# Patient Record
Sex: Male | Born: 1965 | Marital: Married | Smoking: Never smoker
Health system: Southern US, Community
[De-identification: ages and names within clinical notes are randomized; demographics above are authoritative.]

## PROBLEM LIST (undated history)

## (undated) DIAGNOSIS — K219 Gastro-esophageal reflux disease without esophagitis: Secondary | ICD-10-CM

## (undated) DIAGNOSIS — Z201 Contact with and (suspected) exposure to tuberculosis: Secondary | ICD-10-CM

## (undated) DIAGNOSIS — IMO0001 Reserved for inherently not codable concepts without codable children: Secondary | ICD-10-CM

## (undated) DIAGNOSIS — C3490 Malignant neoplasm of unspecified part of unspecified bronchus or lung: Secondary | ICD-10-CM

---

## 2012-07-15 ENCOUNTER — Emergency Department (HOSPITAL_COMMUNITY)
Admission: EM | Admit: 2012-07-15 | Discharge: 2012-07-15 | Disposition: A | Discharge status: Home / Self Care | Payer: No Typology Code available for payment source | Attending: Emergency Medicine | Admitting: Emergency Medicine

## 2012-07-15 ENCOUNTER — Emergency Department (HOSPITAL_COMMUNITY): Payer: No Typology Code available for payment source

## 2012-07-15 DIAGNOSIS — T07XXXA Unspecified multiple injuries, initial encounter: Secondary | ICD-10-CM

## 2012-07-15 DIAGNOSIS — Y9389 Activity, other specified: Secondary | ICD-10-CM | POA: Insufficient documentation

## 2012-07-15 DIAGNOSIS — S6990XA Unspecified injury of unspecified wrist, hand and finger(s), initial encounter: Secondary | ICD-10-CM | POA: Insufficient documentation

## 2012-07-15 DIAGNOSIS — Y92410 Unspecified street and highway as the place of occurrence of the external cause: Secondary | ICD-10-CM | POA: Insufficient documentation

## 2012-07-15 DIAGNOSIS — S3981XA Other specified injuries of abdomen, initial encounter: Secondary | ICD-10-CM | POA: Insufficient documentation

## 2012-07-15 LAB — CBC WITH DIFFERENTIAL/PLATELET
Eosinophils Absolute: 0.2 10*3/uL (ref 0.0–0.7)
Eosinophils Relative: 3 % (ref 0–5)
HCT: 43.4 % (ref 39.0–52.0)
Hemoglobin: 14.4 g/dL (ref 13.0–17.0)
Lymphs Abs: 1.8 10*3/uL (ref 0.7–4.0)
MCH: 29 pg (ref 26.0–34.0)
MCV: 87.5 fL (ref 78.0–100.0)
Monocytes Relative: 7 % (ref 3–12)
RBC: 4.96 MIL/uL (ref 4.22–5.81)

## 2012-07-15 LAB — BASIC METABOLIC PANEL
BUN: 12 mg/dL (ref 6–23)
Calcium: 9.2 mg/dL (ref 8.4–10.5)
GFR calc non Af Amer: >90 mL/min (ref >90)
Glucose, Bld: 85 mg/dL (ref 70–99)

## 2012-07-15 LAB — URINALYSIS, ROUTINE W REFLEX MICROSCOPIC
Glucose, UA: NEGATIVE mg/dL
Hgb urine dipstick: NEGATIVE
Ketones, ur: NEGATIVE mg/dL
Protein, ur: NEGATIVE mg/dL
pH: 5.5 (ref 5.0–8.0)

## 2012-07-15 LAB — URINE MICROSCOPIC-ADD ON

## 2012-07-15 NOTE — ED Notes (Addendum)
Pt c/o right hand pain; pt c/o abd pain into back and neck pain; pt sts pain generalized and denies N/V and started yesterday
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
## 2012-07-15 NOTE — ED Notes (Signed)
Pt transported to xray 

## 2014-05-05 ENCOUNTER — Emergency Department (HOSPITAL_COMMUNITY): Payer: BC Managed Care – PPO

## 2014-05-05 ENCOUNTER — Inpatient Hospital Stay (HOSPITAL_COMMUNITY): Payer: BC Managed Care – PPO

## 2014-05-05 ENCOUNTER — Encounter (HOSPITAL_COMMUNITY): Payer: Self-pay | Admitting: Emergency Medicine

## 2014-05-05 ENCOUNTER — Inpatient Hospital Stay (HOSPITAL_COMMUNITY)
Admission: EM | Admit: 2014-05-05 | Discharge: 2014-05-12 | DRG: 823 | Disposition: A | Discharge status: Home Health | Payer: BC Managed Care – PPO | Attending: Internal Medicine | Admitting: Internal Medicine

## 2014-05-05 DIAGNOSIS — C7931 Secondary malignant neoplasm of brain: Secondary | ICD-10-CM | POA: Diagnosis present

## 2014-05-05 DIAGNOSIS — R519 Headache, unspecified: Secondary | ICD-10-CM

## 2014-05-05 DIAGNOSIS — C3412 Malignant neoplasm of upper lobe, left bronchus or lung: Secondary | ICD-10-CM | POA: Diagnosis present

## 2014-05-05 DIAGNOSIS — C7951 Secondary malignant neoplasm of bone: Secondary | ICD-10-CM | POA: Diagnosis present

## 2014-05-05 DIAGNOSIS — R591 Generalized enlarged lymph nodes: Secondary | ICD-10-CM | POA: Diagnosis present

## 2014-05-05 DIAGNOSIS — R51 Headache: Secondary | ICD-10-CM

## 2014-05-05 DIAGNOSIS — G4452 New daily persistent headache (NDPH): Secondary | ICD-10-CM | POA: Diagnosis present

## 2014-05-05 DIAGNOSIS — M8458XA Pathological fracture in neoplastic disease, other specified site, initial encounter for fracture: Secondary | ICD-10-CM | POA: Diagnosis present

## 2014-05-05 DIAGNOSIS — C7801 Secondary malignant neoplasm of right lung: Secondary | ICD-10-CM | POA: Diagnosis present

## 2014-05-05 DIAGNOSIS — C7802 Secondary malignant neoplasm of left lung: Secondary | ICD-10-CM | POA: Diagnosis present

## 2014-05-05 DIAGNOSIS — F17220 Nicotine dependence, chewing tobacco, uncomplicated: Secondary | ICD-10-CM | POA: Diagnosis present

## 2014-05-05 DIAGNOSIS — G936 Cerebral edema: Secondary | ICD-10-CM | POA: Diagnosis present

## 2014-05-05 DIAGNOSIS — R222 Localized swelling, mass and lump, trunk: Secondary | ICD-10-CM | POA: Diagnosis present

## 2014-05-05 DIAGNOSIS — M545 Low back pain: Secondary | ICD-10-CM | POA: Diagnosis present

## 2014-05-05 DIAGNOSIS — C799 Secondary malignant neoplasm of unspecified site: Secondary | ICD-10-CM

## 2014-05-05 DIAGNOSIS — R9389 Abnormal findings on diagnostic imaging of other specified body structures: Secondary | ICD-10-CM

## 2014-05-05 DIAGNOSIS — R938 Abnormal findings on diagnostic imaging of other specified body structures: Secondary | ICD-10-CM

## 2014-05-05 DIAGNOSIS — R7611 Nonspecific reaction to tuberculin skin test without active tuberculosis: Secondary | ICD-10-CM | POA: Diagnosis present

## 2014-05-05 DIAGNOSIS — Z23 Encounter for immunization: Secondary | ICD-10-CM | POA: Diagnosis not present

## 2014-05-05 DIAGNOSIS — J029 Acute pharyngitis, unspecified: Secondary | ICD-10-CM | POA: Diagnosis present

## 2014-05-05 DIAGNOSIS — C770 Secondary and unspecified malignant neoplasm of lymph nodes of head, face and neck: Secondary | ICD-10-CM | POA: Diagnosis present

## 2014-05-05 DIAGNOSIS — Z201 Contact with and (suspected) exposure to tuberculosis: Secondary | ICD-10-CM | POA: Diagnosis present

## 2014-05-05 DIAGNOSIS — C3490 Malignant neoplasm of unspecified part of unspecified bronchus or lung: Secondary | ICD-10-CM | POA: Diagnosis present

## 2014-05-05 DIAGNOSIS — R221 Localized swelling, mass and lump, neck: Secondary | ICD-10-CM

## 2014-05-05 HISTORY — DX: Malignant neoplasm of unspecified part of unspecified bronchus or lung: C34.90

## 2014-05-05 HISTORY — DX: Contact with and (suspected) exposure to tuberculosis: Z20.1

## 2014-05-05 LAB — CBC WITH DIFFERENTIAL/PLATELET
BASOS ABS: 0 10*3/uL (ref 0.0–0.1)
BASOS PCT: 1 % (ref 0–1)
EOS ABS: 0.2 10*3/uL (ref 0.0–0.7)
EOS PCT: 4 % (ref 0–5)
HCT: 41.4 % (ref 39.0–52.0)
HEMOGLOBIN: 14.5 g/dL (ref 13.0–17.0)
Lymphocytes Relative: 22 % (ref 12–46)
Lymphs Abs: 1.4 10*3/uL (ref 0.7–4.0)
MCH: 29.2 pg (ref 26.0–34.0)
MCHC: 35 g/dL (ref 30.0–36.0)
MCV: 83.5 fL (ref 78.0–100.0)
MONO ABS: 0.5 10*3/uL (ref 0.1–1.0)
MONOS PCT: 8 % (ref 3–12)
NEUTROS ABS: 4.2 10*3/uL (ref 1.7–7.7)
Neutrophils Relative %: 67 % (ref 43–77)
Platelets: 198 10*3/uL (ref 150–400)
RBC: 4.96 MIL/uL (ref 4.22–5.81)
RDW: 12.2 % (ref 11.5–15.5)
WBC: 6.2 10*3/uL (ref 4.0–10.5)

## 2014-05-05 LAB — BASIC METABOLIC PANEL
ANION GAP: 11 (ref 5–15)
BUN: 15 mg/dL (ref 6–23)
CALCIUM: 9.3 mg/dL (ref 8.4–10.5)
CHLORIDE: 103 meq/L (ref 96–112)
CO2: 24 mEq/L (ref 19–32)
CREATININE: 1.29 mg/dL (ref 0.50–1.35)
GFR, EST AFRICAN AMERICAN: 73 mL/min — AB (ref >90)
GFR, EST NON AFRICAN AMERICAN: 63 mL/min — AB (ref >90)
Glucose, Bld: 108 mg/dL — ABNORMAL HIGH (ref 70–99)
Potassium: 4 mEq/L (ref 3.7–5.3)
Sodium: 138 mEq/L (ref 137–147)

## 2014-05-05 LAB — RAPID STREP SCREEN (MED CTR MEBANE ONLY): Streptococcus, Group A Screen (Direct): NEGATIVE

## 2014-05-05 MED ORDER — ONDANSETRON HCL 4 MG/2ML IJ SOLN: 4.0000 mg | Freq: Four times a day (QID) | INTRAMUSCULAR | Status: DC | PRN

## 2014-05-05 MED ORDER — ONDANSETRON HCL 4 MG/2ML IJ SOLN
4.0000 mg | Freq: Once | INTRAMUSCULAR | Status: AC
  Administered 2014-05-05: 4 mg via INTRAVENOUS
  Filled 2014-05-05: qty 2

## 2014-05-05 MED ORDER — ONDANSETRON HCL 4 MG PO TABS: 4.0000 mg | ORAL_TABLET | Freq: Four times a day (QID) | ORAL | Status: DC | PRN

## 2014-05-05 MED ORDER — IOHEXOL 300 MG/ML  SOLN
75.0000 mL | Freq: Once | INTRAMUSCULAR | Status: AC | PRN
  Administered 2014-05-05: 75 mL via INTRAVENOUS

## 2014-05-05 MED ORDER — ONDANSETRON 4 MG PO TBDP: 4.0000 mg | ORAL_TABLET | Freq: Once | ORAL | Status: DC

## 2014-05-05 MED ORDER — SODIUM CHLORIDE 0.9 % IV BOLUS (SEPSIS)
1000.0000 mL | Freq: Once | INTRAVENOUS | Status: AC
  Administered 2014-05-05: 1000 mL via INTRAVENOUS

## 2014-05-05 MED ORDER — DOCUSATE SODIUM 100 MG PO CAPS
100.0000 mg | ORAL_CAPSULE | Freq: Two times a day (BID) | ORAL | Status: DC
  Administered 2014-05-06 – 2014-05-12 (×12): 100 mg via ORAL
  Filled 2014-05-05 (×14): qty 1

## 2014-05-05 MED ORDER — HYDROCODONE-ACETAMINOPHEN 5-325 MG PO TABS: 1.0000 | ORAL_TABLET | Freq: Once | ORAL | Status: DC

## 2014-05-05 MED ORDER — MORPHINE SULFATE 4 MG/ML IJ SOLN
4.0000 mg | Freq: Once | INTRAMUSCULAR | Status: AC
  Administered 2014-05-05: 4 mg via INTRAVENOUS
  Filled 2014-05-05: qty 1

## 2014-05-05 MED ORDER — ACETAMINOPHEN 650 MG RE SUPP: 650.0000 mg | Freq: Four times a day (QID) | RECTAL | Status: DC | PRN

## 2014-05-05 MED ORDER — ACETAMINOPHEN 325 MG PO TABS: 650.0000 mg | ORAL_TABLET | Freq: Four times a day (QID) | ORAL | Status: DC | PRN

## 2014-05-05 MED ORDER — HYDROCODONE-ACETAMINOPHEN 5-325 MG PO TABS
1.0000 | ORAL_TABLET | ORAL | Status: DC | PRN
  Administered 2014-05-07 – 2014-05-12 (×7): 2 via ORAL
  Filled 2014-05-05 (×7): qty 2

## 2014-05-05 MED ORDER — MORPHINE SULFATE 2 MG/ML IJ SOLN
2.0000 mg | INTRAMUSCULAR | Status: DC | PRN
  Administered 2014-05-08 – 2014-05-11 (×2): 2 mg via INTRAVENOUS
  Filled 2014-05-05 (×2): qty 1

## 2014-05-05 NOTE — ED Provider Notes (Signed)
CSN: [id number]     Arrival date & time 05/05/14  1452 History  This chart was scribed for non-physician practitioner, Margarita Mail, PA-C,working with Richarda Blade, MD, by Marlowe Kays, ED Scribe. This patient was seen in room TR10C/TR10C and the patient's care was started at 4:40 PM  Chief Complaint  Patient presents with  . Sore Throat   Patient is a 48 y.o. male presenting with pharyngitis. The history is provided by the patient. A language interpreter was used.  Sore Throat  Sore Throat Associated symptoms include chills and a sore throat. Pertinent negatives include no diaphoresis, fever, nausea or vomiting.   HPI Comments:  Russell Stanley is a 48 y.o. male who presents to the Emergency Department complaining of sore throat for one month. Pt reports that there is an associated generalized body aches and a painful nodule on the left side of his neck. He reports the pain is a 2-3/10. He has taken medication for pain but cannot recall the name. He denies appetite change, weight loss, otalgia, dental pain, difficulty swallowing, fever, night sweats, nausea or vomiting.He has a slight cough but denies hemoptysis. Pt does not have a PCP. He is not a smoker but reports chewing tobacco.  History reviewed. No pertinent past medical history. History reviewed. No pertinent past surgical history. History reviewed. No pertinent family history. History  Substance Use Topics  . Smoking status: Never Smoker   . Smokeless tobacco: Current User    Types: Chew  . Alcohol Use: No    Review of Systems  Constitutional: Positive for chills. Negative for fever, diaphoresis and appetite change.  HENT: Positive for sore throat. Negative for dental problem, ear pain and trouble swallowing.   Gastrointestinal: Negative for nausea and vomiting.  Hematological: Positive for adenopathy.  All other systems reviewed and are negative.   Allergies  Review of patient's allergies indicates no known  allergies.  Home Medications   Prior to Admission medications   Not on File   Triage Vitals: BP 115/77  Pulse 86  Temp(Src) 97.6 F (36.4 C) (Oral)  Resp 12  SpO2 97% Physical Exam  Nursing note and vitals reviewed. Constitutional: He is oriented to person, place, and time. He appears well-developed and well-nourished.  HENT:  Head: Normocephalic and atraumatic.  Poor dentition. Multiple dental carries.  Eyes: EOM are normal.  Neck: Normal range of motion.  Cardiovascular: Normal rate, regular rhythm and normal heart sounds.  Exam reveals no gallop and no friction rub.   No murmur heard. Pulmonary/Chest: Effort normal and breath sounds normal. No respiratory distress. He has no wheezes. He has no rales.  Musculoskeletal: Normal range of motion.  Lymphadenopathy:    He has cervical adenopathy.  Larger than 2 cm swollen lymphadenopathy in posterior cervical chain of left side only.  Neurological: He is alert and oriented to person, place, and time.  Skin: Skin is warm and dry.  Psychiatric: He has a normal mood and affect. His behavior is normal.    ED Course  Procedures (including critical care time) DIAGNOSTIC STUDIES: Oxygen Saturation is 97% on RA, normal by my interpretation.   COORDINATION OF CARE: 4:53 PM- Will speak with Dr. Eulis Foster about appropriate course of treatment. Pt verbalizes understanding and agrees to plan.  Medications  acetaminophen (TYLENOL) tablet 650 mg (not administered)    Or  acetaminophen (TYLENOL) suppository 650 mg (not administered)  HYDROcodone-acetaminophen (NORCO/VICODIN) 5-325 MG per tablet 1-2 tablet (not administered)  docusate sodium (COLACE) capsule 100 mg (  100 mg Oral Not Given 05/06/14 0000)  ondansetron (ZOFRAN) tablet 4 mg (not administered)    Or  ondansetron (ZOFRAN) injection 4 mg (not administered)  morphine 2 MG/ML injection 2 mg (not administered)  0.9 %  sodium chloride infusion (not administered)  sodium chloride  HYPERTONIC 3 % nebulizer solution 5 mL (not administered)  sodium chloride 0.9 % bolus 1,000 mL (0 mLs Intravenous Stopped 05/05/14 2020)  iohexol (OMNIPAQUE) 300 MG/ML solution 75 mL (75 mLs Intravenous Contrast Given 05/05/14 1835)  morphine 4 MG/ML injection 4 mg (4 mg Intravenous Given 05/05/14 2206)  ondansetron (ZOFRAN) injection 4 mg (4 mg Intravenous Given 05/05/14 2203)    Labs Review Labs Reviewed  BASIC METABOLIC PANEL - Abnormal; Notable for the following:    Glucose, Bld 108 (*)    GFR calc non Af Amer 63 (*)    GFR calc Af Amer 73 (*)    All other components within normal limits  COMPREHENSIVE METABOLIC PANEL - Abnormal; Notable for the following:    Sodium 136 (*)    Glucose, Bld 176 (*)    AST 45 (*)    Alkaline Phosphatase 407 (*)    All other components within normal limits  LACTATE DEHYDROGENASE - Abnormal; Notable for the following:    LDH 271 (*)    All other components within normal limits  RAPID STREP SCREEN  CULTURE, GROUP A STREP  AFB CULTURE WITH SMEAR  CULTURE, EXPECTORATED SPUTUM-ASSESSMENT  AFB CULTURE WITH SMEAR  BODY FLUID CULTURE  FUNGUS CULTURE W SMEAR  CBC WITH DIFFERENTIAL  MAGNESIUM  PHOSPHORUS  TSH  CBC  PROTIME-INR  APTT  QUANTIFERON TB GOLD ASSAY  HIV ANTIBODY (ROUTINE TESTING)  HEPATITIS PANEL, ACUTE  SURGICAL PATHOLOGY    Imaging Review Ct Head Wo Contrast  05/06/2014   CLINICAL DATA:  48 year old male with headache.  Initial encounter.  EXAM: CT HEAD WITHOUT CONTRAST  TECHNIQUE: Contiguous axial images were obtained from the base of the skull through the vertex without intravenous contrast.  COMPARISON:  Neck CT same date.  FINDINGS: There are at least 3 hyperdense lesions within the brain located within the medial aspect of the posterior frontal lobes and right parietal lobe. Left posterior medial frontal lobe lesion with minimal surrounding vasogenic edema. No mass effect. Given the CT findings, intracranial findings may  represent intracranial metastatic disease. Spread of infection not entirely excluded given the miliary appearance of the visualized lungs. Follow-up brain MR with contrast would be helpful for further delineation.  No intracranial hemorrhage separate from the above described findings.  No hydrocephalus.  No CT evidence of large acute infarct.  Remote medial wall fracture right orbit.  IMPRESSION: At least 3 intracranial lesions possibly representing metastatic disease with infection not entirely excluded given the miliary appearance of the visualized upper lung. MR with contrast may be considered for further delineation.   Electronically Signed   By: Chauncey Cruel M.D.   On: 05/06/2014 00:06   Ct Soft Tissue Neck W Contrast  05/05/2014   ADDENDUM REPORT: 05/05/2014 20:33  ADDENDUM: Results were discussed with Dr. Roel Cluck, the possibility of infection such as miliary TB with superimposed active TB is a consideration. Aspiration of the necrotic left level 5 lymph node with culture and cytology may prove helpful for further delineation. Additionally, formal chest CT may be considered.   Electronically Signed   By: Chauncey Cruel M.D.   On: 05/05/2014 20:33   05/05/2014   CLINICAL DATA:  48 year old male presenting  with left-sided neck pain for the past month getting worse. Initial encounter.  EXAM: CT NECK WITH CONTRAST  TECHNIQUE: Multidetector CT imaging of the neck was performed using the standard protocol following the bolus administration of intravenous contrast.  CONTRAST:  26mL OMNIPAQUE IOHEXOL 300 MG/ML  SOLN  COMPARISON:  None.  FINDINGS: Left upper lobe 0 4.8 x 4.2 x 3.2 cm mass highly concerning for primary malignancy with diffuse miliary pulmonary metastatic lesions bilaterally. Additionally, neck adenopathy greater on the left and most notable in the left supraclavicular region and left level 5 region where necrotic adenopathy with extracapsular spread is noted.  Small pericardial effusion. Left hilar  and subcarinal/pretracheal/AP window adenopathy.  Osseous metastatic disease to the T5 vertebral body suspected.  No primary neck mass is noted. Direct visualization would be necessary to exclude subtle mucosal abnormality.  Limited imaging of intracranial structures without evidence of metastatic disease.  IMPRESSION: Left upper lobe 0 4.8 x 4.2 x 3.2 cm mass highly concerning for primary malignancy with diffuse miliary pulmonary metastatic lesions bilaterally.  Neck adenopathy greater on the left and most notable in the left supraclavicular region and left level 5 region where necrotic adenopathy with extracapsular spread is noted.  Small pericardial effusion. Left hilar and subcarinal/pretracheal/AP window adenopathy.  Osseous metastatic disease to the T5 vertebral body suspected.  No primary neck mass is noted. Direct visualization would be necessary to exclude subtle mucosal abnormality.  These results were called by telephone at the time of interpretation on 05/05/2014 at 7:04pm to [facility] PA, who verbally acknowledged these results.  Electronically Signed: By: Chauncey Cruel M.D. On: 05/05/2014 19:10     EKG Interpretation None      MDM   Final diagnoses:  Headache  New daily persistent headache  Abnormal CT scan, chest  Chest mass  Neck mass  Brain metastases    Discussed case with Dr Eulis Foster. Concern for infectious vs neoplastic process. No outside f/u. Will obtain a ct soft tissue and basic labs.  Patient CT concerning for Miliary metastatic disease vs. TB. Patient placed on aerosol precautions. He denies any known exposure to TB.   Patient labs are unremarkable.Pateitn admitted by Dr. Roel Cluck.   I personally performed the services described in this documentation, which was scribed in my presence. The recorded information has been reviewed and is accurate.    Margarita Mail, PA-C 05/06/14 1513

## 2014-05-05 NOTE — ED Notes (Signed)
Internal at bedside.

## 2014-05-05 NOTE — ED Notes (Signed)
Pt to CT at this time.

## 2014-05-05 NOTE — ED Notes (Signed)
Admitting and PA at bedside, interpreter paged.

## 2014-05-05 NOTE — ED Notes (Signed)
Pt reports sore throat x 1 month with generalized body aches. Denies fever/chills. Airway intact. Pt also reports "lump" on left side of neck, denies pain on palpation. NAd.

## 2014-05-05 NOTE — ED Notes (Signed)
Flow notified of pt need for bed.

## 2014-05-05 NOTE — ED Notes (Addendum)
Charge RN notifiedf of pt isolation orders.

## 2014-05-05 NOTE — H&P (Addendum)
PCP:  No primary provider on file.    Chief Complaint:  Neck swelling  HPI: Russell Stanley is a 48 y.o. male   has no past medical history on file.   Presented with neck swelling for the past 4 months. Patient Has immigrated from [place] 4 years ago. He does not speak english, history obtain through the interpreter. Patient was evaluated in ER and was found to have firm left neck lymphadenopathy.  CT of the neck was ordered showing large necrosing neck lymphadenopathy Family and patient denies any hx of fever, weight loss but has occasional chills.Occasional cough non-productive. No known hx of Tb exposure. He never smoked but chews tobacco. 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 Patient endorses headache has been persistent as well as lower back pain. Denies any localized neurological complaints  Hospitalist was called for admission for  abnormal CT findings worrisome for metastatic malignancy versus miliary TB  Review of Systems:    Pertinent positives include:  Chills, non-productive cough, headaches back pain  Constitutional:  No weight loss, night sweats, Fevers, , fatigue, weight loss  HEENT:  No , Difficulty swallowing,Tooth/dental problems,Sore throat,  No sneezing, itching, ear ache, nasal congestion, post nasal drip,  Cardio-vascular:  No chest pain, Orthopnea, PND, anasarca, dizziness, palpitations.no Bilateral lower extremity swelling  GI:  No heartburn, indigestion, abdominal pain, nausea, vomiting, diarrhea, change in bowel habits, loss of appetite, melena, blood in stool, hematemesis Resp:  no shortness of breath at rest. No dyspnea on exertion, No excess mucus, no productive cough, No No coughing up of blood.No change in color of mucus.No wheezing. Skin:  no rash or lesions. No jaundice GU:  no dysuria, change in color of urine, no urgency or frequency. No straining to urinate.  No flank pain.  Musculoskeletal:  No joint pain or  no joint swelling. No decreased range of motion. No back pain.  Psych:  No change in mood or affect. No depression or anxiety. No memory loss.  Neuro: no localizing neurological complaints, no tingling, no weakness, no double vision, no gait abnormality, no slurred speech, no confusion  Otherwise ROS are negative except for above, 10 systems were reviewed  Past Medical History: History reviewed. No pertinent past medical history. History reviewed. No pertinent past surgical history.   Medications: Prior to Admission medications   Not on File    Allergies:  No Known Allergies  Social History:  Ambulatory independently  Lives at home With family no small children in the house   reports that he has never smoked. His smokeless tobacco use includes Chew. He reports that he does not drink alcohol or use illicit drugs.    Family History: family history is not on file.    Physical Exam: Patient Vitals for the past 24 hrs:  BP Temp Temp src Pulse Resp SpO2  05/05/14 1457 115/77 mmHg 97.6 F (36.4 C) Oral 86 12 97 %    1. General:  in No Acute distress 2. Psychological: Alert and Oriented 3. Head/ENT:   Moist  Mucous Membranes                          Head Non traumatic, neck supple firm lymphadenopathy present on the left                          Poor Dentition extensive and a mild 4. SKIN: normal  Skin turgor,  Skin clean Dry and intact no rash 5. Heart: Regular rate and rhythm no Murmur, Rub or gallop 6. Lungs: no wheezes or crackles somewhat diminished at the bases   7. Abdomen: Soft, non-tender, Non distended 8. Lower extremities: no clubbing, cyanosis, or edema 9. Neurologically Grossly intact, moving all 4 extremities equally 10. MSK: Normal range of motion  body mass index is unknown because there is no height or weight on file.   Labs on Admission:   Recent Labs  05/05/14 1722  NA 138  K 4.0  CL 103  CO2 24  GLUCOSE 108*  BUN 15  CREATININE 1.29    CALCIUM 9.3   No results found for this basename: AST, ALT, ALKPHOS, BILITOT, PROT, ALBUMIN,  in the last 72 hours No results found for this basename: LIPASE, AMYLASE,  in the last 72 hours  Recent Labs  05/05/14 1722  WBC 6.2  NEUTROABS 4.2  HGB 14.5  HCT 41.4  MCV 83.5  PLT 198   No results found for this basename: CKTOTAL, CKMB, CKMBINDEX, TROPONINI,  in the last 72 hours No results found for this basename: TSH, T4TOTAL, FREET3, T3FREE, THYROIDAB,  in the last 72 hours No results found for this basename: VITAMINB12, FOLATE, FERRITIN, TIBC, IRON, RETICCTPCT,  in the last 72 hours No results found for this basename: HGBA1C    CrCl is unknown because there is no height on file for the current visit. ABG No results found for this basename: phart, pco2, po2, hco3, tco2, acidbasedef, o2sat     No results found for this basename: DDIMER     Other results:  BNP (last 3 results) No results found for this basename: PROBNP,  in the last 8760 hours  There were no vitals filed for this visit.   Cultures: No results found for this basename: sdes, specrequest, cult, reptstatus    Radiological Exams on Admission: Ct Soft Tissue Neck W Contrast  05/05/2014   ADDENDUM REPORT: 05/05/2014 20:33  ADDENDUM: Results were discussed with Dr. Roel Cluck, the possibility of infection such as miliary TB with superimposed active TB is a consideration. Aspiration of the necrotic left level 5 lymph node with culture and cytology may prove helpful for further delineation. Additionally, formal chest CT may be considered.   Electronically Signed   By: Chauncey Cruel M.D.   On: 05/05/2014 20:33   05/05/2014   CLINICAL DATA:  48 year old male presenting with left-sided neck pain for the past month getting worse. Initial encounter.  EXAM: CT NECK WITH CONTRAST  TECHNIQUE: Multidetector CT imaging of the neck was performed using the standard protocol following the bolus administration of intravenous contrast.   CONTRAST:  76mL OMNIPAQUE IOHEXOL 300 MG/ML  SOLN  COMPARISON:  None.  FINDINGS: Left upper lobe 0 4.8 x 4.2 x 3.2 cm mass highly concerning for primary malignancy with diffuse miliary pulmonary metastatic lesions bilaterally. Additionally, neck adenopathy greater on the left and most notable in the left supraclavicular region and left level 5 region where necrotic adenopathy with extracapsular spread is noted.  Small pericardial effusion. Left hilar and subcarinal/pretracheal/AP window adenopathy.  Osseous metastatic disease to the T5 vertebral body suspected.  No primary neck mass is noted. Direct visualization would be necessary to exclude subtle mucosal abnormality.  Limited imaging of intracranial structures without evidence of metastatic disease.  IMPRESSION: Left upper lobe 0 4.8 x 4.2 x 3.2 cm mass highly concerning for primary malignancy with diffuse miliary pulmonary metastatic lesions bilaterally.  Neck adenopathy greater on the left and most notable in the left supraclavicular region and left level 5 region where necrotic adenopathy with extracapsular spread is noted.  Small pericardial effusion. Left hilar and subcarinal/pretracheal/AP window adenopathy.  Osseous metastatic disease to the T5 vertebral body suspected.  No primary neck mass is noted. Direct visualization would be necessary to exclude subtle mucosal abnormality.  These results were called by telephone at the time of interpretation on 05/05/2014 at 7:04pm to [facility] PA, who verbally acknowledged these results.  Electronically Signed: By: Chauncey Cruel M.D. On: 05/05/2014 19:10    Chart has been reviewed  Assessment/Plan  48 year old male with no significant past medical history native of [place] presents with neck mass for the past 4 month CT of the chest worrisome for malignancy versus milliary TB.   Present on Admission:  . Abnormal CT scan, chest - will need further evaluation. Given lymph node that should be relatively  easy  to access   will ordr ultrasound guided biopsy. Discussed case with infectious disease with this point recommends father workup, airborne isolation, HIV testing, TB testing and sputum culture  . Chest mass - malignancy versus infectious process we'll need to obtain tissue biopsy. Given headache will obtain CT of the head to further evaluate in the future if there is evidence of possible metastatic spread MRI would be warranted. Will order staging CT of the chest and abdomen.  Headache CT scan of the head tonight.   Prophylaxis: SCD   CODE STATUS:  FULL CODE   Other plan as per orders.  I have spent a total of 75 min on this admission extra time was taken to discuss case with Radiology and ID Dr. Mertie Moores 05/05/2014, 8:55 PM  [facility]  Pager [phone]   after 2 AM please page floor coverage PA If 7AM-7PM, please contact the day team taking care of the patient  [email or web address]  Password [email or web address]

## 2014-05-06 ENCOUNTER — Inpatient Hospital Stay (HOSPITAL_COMMUNITY): Payer: BC Managed Care – PPO

## 2014-05-06 DIAGNOSIS — R591 Generalized enlarged lymph nodes: Secondary | ICD-10-CM

## 2014-05-06 LAB — COMPREHENSIVE METABOLIC PANEL
ALT: 45 U/L (ref 0–53)
AST: 45 U/L — ABNORMAL HIGH (ref 0–37)
Albumin: 3.6 g/dL (ref 3.5–5.2)
Alkaline Phosphatase: 407 U/L — ABNORMAL HIGH (ref 39–117)
Anion gap: 13 (ref 5–15)
BUN: 14 mg/dL (ref 6–23)
CO2: 22 mEq/L (ref 19–32)
Calcium: 9.4 mg/dL (ref 8.4–10.5)
Chloride: 101 mEq/L (ref 96–112)
Creatinine, Ser: 0.99 mg/dL (ref 0.50–1.35)
GFR calc Af Amer: >90 mL/min (ref >90)
GFR calc non Af Amer: >90 mL/min (ref >90)
Glucose, Bld: 176 mg/dL — ABNORMAL HIGH (ref 70–99)
Potassium: 3.7 mEq/L (ref 3.7–5.3)
Sodium: 136 mEq/L — ABNORMAL LOW (ref 137–147)
Total Bilirubin: 0.4 mg/dL (ref 0.3–1.2)
Total Protein: 7.7 g/dL (ref 6.0–8.3)

## 2014-05-06 LAB — PROTIME-INR
INR: 1.09 (ref 0.00–1.49)
PROTHROMBIN TIME: 14.2 s (ref 11.6–15.2)

## 2014-05-06 LAB — TSH: TSH: 1.5 u[IU]/mL (ref 0.350–4.500)

## 2014-05-06 LAB — HEPATITIS PANEL, ACUTE
HCV Ab: NEGATIVE
HEP A IGM: NONREACTIVE
HEP B C IGM: NONREACTIVE
Hepatitis B Surface Ag: NEGATIVE

## 2014-05-06 LAB — CBC
HEMATOCRIT: 41.6 % (ref 39.0–52.0)
Hemoglobin: 14.5 g/dL (ref 13.0–17.0)
MCH: 29.2 pg (ref 26.0–34.0)
MCHC: 34.9 g/dL (ref 30.0–36.0)
MCV: 83.7 fL (ref 78.0–100.0)
Platelets: 198 10*3/uL (ref 150–400)
RBC: 4.97 MIL/uL (ref 4.22–5.81)
RDW: 12.2 % (ref 11.5–15.5)
WBC: 6.4 10*3/uL (ref 4.0–10.5)

## 2014-05-06 LAB — PHOSPHORUS: Phosphorus: 3.3 mg/dL (ref 2.3–4.6)

## 2014-05-06 LAB — HIV ANTIBODY (ROUTINE TESTING W REFLEX): HIV 1&2 Ab, 4th Generation: NONREACTIVE

## 2014-05-06 LAB — LACTATE DEHYDROGENASE: LDH: 271 U/L — ABNORMAL HIGH (ref 94–250)

## 2014-05-06 LAB — MAGNESIUM: Magnesium: 2.1 mg/dL (ref 1.5–2.5)

## 2014-05-06 LAB — APTT: APTT: 29 s (ref 24–37)

## 2014-05-06 MED ORDER — SODIUM CHLORIDE 3 % IN NEBU
5.0000 mL | INHALATION_SOLUTION | Freq: Once | RESPIRATORY_TRACT | Status: AC | PRN
  Filled 2014-05-06: qty 8

## 2014-05-06 MED ORDER — GADOBENATE DIMEGLUMINE 529 MG/ML IV SOLN
10.0000 mL | Freq: Once | INTRAVENOUS | Status: AC | PRN
  Administered 2014-05-06: 15 mL via INTRAVENOUS

## 2014-05-06 MED ORDER — SODIUM CHLORIDE 0.9 % IV SOLN
INTRAVENOUS | Status: DC
  Administered 2014-05-06 – 2014-05-12 (×13): via INTRAVENOUS

## 2014-05-06 NOTE — Consult Note (Signed)
Chief Complaint: Chief Complaint  Patient presents with  . Sore Throat  L neck mass Abnormal CT  Referring Physician(s): [facility]  History of Present Illness: Russell Stanley is a 48 y.o. male  Pt with noted L neck mass x 1 mo Denies fever; wt loss +cough CT shows lung lesions and Brain lesions Cervical lymphadenopathy Immigrated from [place] 4 yrs ago Request made for IR consult for neck lesion biopsy Dr Anselm Pancoast has reviewed imaging and chart Feels pt is appropriate for procedure  I have seen and examined pt Does not speak English Spoke to pt through Interpreter [phone] via phone  History reviewed. No pertinent past medical history.  History reviewed. No pertinent past surgical history.  Allergies: Review of patient's allergies indicates no known allergies.  Medications: Prior to Admission medications   Not on File    History reviewed. No pertinent family history.  History   Social History  . Marital Status: Married    Spouse Name: N/A    Number of Children: N/A  . Years of Education: N/A   Social History Main Topics  . Smoking status: Never Smoker   . Smokeless tobacco: Current User    Types: Chew  . Alcohol Use: No  . Drug Use: No  . Sexual Activity: None   Other Topics Concern  . None   Social History Narrative  . None    Review of Systems: A 12 point ROS discussed and pertinent positives are indicated in the HPI above.  All other systems are negative.  Review of Systems  Constitutional: Negative for fever, activity change and unexpected weight change.  HENT: Negative for mouth sores.        Neck swelling   Respiratory: Positive for cough. Negative for choking, shortness of breath and wheezing.   Gastrointestinal: Negative for abdominal pain.  Musculoskeletal: Positive for neck pain. Negative for neck stiffness.  Neurological: Positive for speech difficulty. Negative for facial asymmetry.  Psychiatric/Behavioral: Negative for confusion.    Vital  Signs: BP 122/76  Pulse 78  Temp(Src) 98.4 F (36.9 C) (Oral)  Resp 16  Wt 71.079 kg (156 lb 11.2 oz)  SpO2 98%  Physical Exam  Constitutional: He is oriented to person, place, and time. He appears well-nourished.  Cardiovascular: Normal rate and regular rhythm.   Pulmonary/Chest: Effort normal and breath sounds normal. He has no wheezes.  Abdominal: Soft. Bowel sounds are normal. There is no tenderness.  Musculoskeletal: Normal range of motion.  Neurological: He is alert and oriented to person, place, and time.  Skin: Skin is warm and dry.  Psychiatric: He has a normal mood and affect. His behavior is normal. Judgment and thought content normal.  Consented pt via phone with interpreter [phone]    Imaging: Ct Head Wo Contrast  05/06/2014   CLINICAL DATA:  48 year old male with headache.  Initial encounter.  EXAM: CT HEAD WITHOUT CONTRAST  TECHNIQUE: Contiguous axial images were obtained from the base of the skull through the vertex without intravenous contrast.  COMPARISON:  Neck CT same date.  FINDINGS: There are at least 3 hyperdense lesions within the brain located within the medial aspect of the posterior frontal lobes and right parietal lobe. Left posterior medial frontal lobe lesion with minimal surrounding vasogenic edema. No mass effect. Given the CT findings, intracranial findings may represent intracranial metastatic disease. Spread of infection not entirely excluded given the miliary appearance of the visualized lungs. Follow-up brain MR with contrast would be helpful for further delineation.  No intracranial hemorrhage separate from the above described findings.  No hydrocephalus.  No CT evidence of large acute infarct.  Remote medial wall fracture right orbit.  IMPRESSION: At least 3 intracranial lesions possibly representing metastatic disease with infection not entirely excluded given the miliary appearance of the visualized upper lung. MR with contrast may be considered for  further delineation.   Electronically Signed   By: Chauncey Cruel M.D.   On: 05/06/2014 00:06   Ct Soft Tissue Neck W Contrast  05/05/2014   ADDENDUM REPORT: 05/05/2014 20:33  ADDENDUM: Results were discussed with Dr. Roel Cluck, the possibility of infection such as miliary TB with superimposed active TB is a consideration. Aspiration of the necrotic left level 5 lymph node with culture and cytology may prove helpful for further delineation. Additionally, formal chest CT may be considered.   Electronically Signed   By: Chauncey Cruel M.D.   On: 05/05/2014 20:33   05/05/2014   CLINICAL DATA:  48 year old male presenting with left-sided neck pain for the past month getting worse. Initial encounter.  EXAM: CT NECK WITH CONTRAST  TECHNIQUE: Multidetector CT imaging of the neck was performed using the standard protocol following the bolus administration of intravenous contrast.  CONTRAST:  72mL OMNIPAQUE IOHEXOL 300 MG/ML  SOLN  COMPARISON:  None.  FINDINGS: Left upper lobe 0 4.8 x 4.2 x 3.2 cm mass highly concerning for primary malignancy with diffuse miliary pulmonary metastatic lesions bilaterally. Additionally, neck adenopathy greater on the left and most notable in the left supraclavicular region and left level 5 region where necrotic adenopathy with extracapsular spread is noted.  Small pericardial effusion. Left hilar and subcarinal/pretracheal/AP window adenopathy.  Osseous metastatic disease to the T5 vertebral body suspected.  No primary neck mass is noted. Direct visualization would be necessary to exclude subtle mucosal abnormality.  Limited imaging of intracranial structures without evidence of metastatic disease.  IMPRESSION: Left upper lobe 0 4.8 x 4.2 x 3.2 cm mass highly concerning for primary malignancy with diffuse miliary pulmonary metastatic lesions bilaterally.  Neck adenopathy greater on the left and most notable in the left supraclavicular region and left level 5 region where necrotic adenopathy  with extracapsular spread is noted.  Small pericardial effusion. Left hilar and subcarinal/pretracheal/AP window adenopathy.  Osseous metastatic disease to the T5 vertebral body suspected.  No primary neck mass is noted. Direct visualization would be necessary to exclude subtle mucosal abnormality.  These results were called by telephone at the time of interpretation on 05/05/2014 at 7:04pm to [facility] PA, who verbally acknowledged these results.  Electronically Signed: By: Chauncey Cruel M.D. On: 05/05/2014 19:10    Labs:  CBC:  Recent Labs  05/05/14 1722 05/06/14 0358  WBC 6.2 6.4  HGB 14.5 14.5  HCT 41.4 41.6  PLT 198 198    COAGS: No results found for this basename: INR, APTT,  in the last 8760 hours  BMP:  Recent Labs  05/05/14 1722 05/06/14 0358  NA 138 136*  K 4.0 3.7  CL 103 101  CO2 24 22  GLUCOSE 108* 176*  BUN 15 14  CALCIUM 9.3 9.4  CREATININE 1.29 0.99  GFRNONAA 63* >90  GFRAA 73* >90    LIVER FUNCTION TESTS:  Recent Labs  05/06/14 0358  BILITOT 0.4  AST 45*  ALT 45  ALKPHOS 407*  PROT 7.7  ALBUMIN 3.6    TUMOR MARKERS: No results found for this basename: AFPTM, CEA, CA199, CHROMGRNA,  in the last 8760 hours  Assessment and Plan:  L cervical LAN Lung and brain lesions Scheduled now for L neck LAN bx Scheduled for 10/19 Pt aware of procedure benefits and risks and agreeable to proceed Consent signed andin chart Consent via interpreter [phone]  Thank you for this interesting consult.  I greatly enjoyed meeting Dent Plantz and look forward to participating in their care.   I spent a total of 40 minutes face to face in clinical consultation, greater than 50% of which was counseling/coordinating care for cervical LN bx  Signed: Merlon Alcorta A 05/06/2014, 10:33 AM

## 2014-05-06 NOTE — Consult Note (Signed)
    [facility]     Reason for Consult: ? Tb vs primary lung, lymphoma    Referring Physician: Dr. Tyrell Antonio  Active Problems:   Abnormal CT scan, chest   Chest mass   . docusate sodium  100 mg Oral BID    Recommendations: Biopsy by IR tomorrow (discussed with radiology) I will hold on further imaging until we have a better idea of diagnosis with biopsy AFB sputums Biopsy for pathology including AFB and fungal stains and would also want AFB culture and smear, fungal culture and bacterial culture (in saline) quantiferon sent Isolation May need to consider MRI of spine for ?Pott's if positive AFB or granulomas on path MRI to be done for brain lesions  Dr. Baxter Flattery to follow up tomorrow  Assessment: He has lymphadonapthy concerning for primary lung CA vs Tb.     Antibiotics: none  HPI: Russell Stanley is a 48 y.o. male from [place], here for 4 years, no known medical problems but with the lymphadenopathy noted over the last 4 months.  CT notable for large necrosing neck lymphadenopathy.  No fever, no chills, mild cough.  No known exposures.  Chews tobacco.  + headache, low back pain.     Review of Systems: A comprehensive review of systems was negative.  History reviewed. No pertinent past medical history.  History  Substance Use Topics  . Smoking status: Never Smoker   . Smokeless tobacco: Current User    Types: Chew  . Alcohol Use: No    History reviewed. No pertinent family history. No Known Allergies  OBJECTIVE: Blood pressure 122/76, pulse 78, temperature 98.4 F (36.9 C), temperature source Oral, resp. rate 16, height 0' (0 m), weight 156 lb 11.2 oz (71.079 kg), SpO2 98.00%. General: awake, alert, nad Skin: no rashes Lungs: CTA B Cor: RRR Abdomen: soft, nt, nd Ext: no edema LAD: notable soft, lad in left neck, supraclavicular  Microbiology: Recent Results (from the past 240 hour(s))  RAPID STREP SCREEN     Status: None   Collection  Time    05/05/14  3:10 PM      Result Value Ref Range Status   Streptococcus, Group A Screen (Direct) NEGATIVE  NEGATIVE Final   Comment: (NOTE)     A Rapid Antigen test may result negative if the antigen level in the     sample is below the detection level of this test. The FDA has not     cleared this test as a stand-alone test therefore the rapid antigen     negative result has reflexed to a Group A Strep culture.    Scharlene Gloss, [facility] [place] [email or web address] [phone] pager  [phone] cell 05/06/2014, 10:31 AM

## 2014-05-06 NOTE — Progress Notes (Signed)
[facility] PROGRESS NOTE  Russell Stanley GNF:[id number] DOB: [birth year] DOA: 05/05/2014 PCP: No primary provider on file.  Assessment/Plan: 1-Neck Lymphadenopathy, left upper lobe Mass, 3 intracranial lesions ;  For lymph node biopsy 10-19: Culture for bacterial, AFB, fungal ordered. Fungal and AFB stain ordered. Pathology.  MRI brain with contrast ordered.  Will check LDH.  quantiferon sent HIV, hepatitis panel ordered.  Sputum for AFB, culture.      language/  Loistine Simas  Code Status: Full Code.  Family Communication: Care discussed with patient.  Disposition Plan: Remain in the hospital.    Consultants:  ID, phone consultation.   Procedures:  US guide biopsy neck mass.   Antibiotics:  none  HPI/Subjective: Use translator; Patient relates neck pain, denies weight loss, night sweat. Does relates occasional cough.   Objective: Filed Vitals:   05/06/14 0534  BP: 119/74  Pulse: 81  Temp: 98.2 F (36.8 C)  Resp: 18    Intake/Output Summary (Last 24 hours) at 05/06/14 0852 Last data filed at 05/06/14 0536  Gross per 24 hour  Intake      0 ml  Output    225 ml  Net   -225 ml   Filed Weights   05/06/14 0010  Weight: 71.079 kg (156 lb 11.2 oz)    Exam:   General:  Alert, in no distress.   Cardiovascular: S 1, S 2 RRR  Respiratory: CTA  Abdomen: Bs present, soft, NT  Musculoskeletal: no edema.    Data Reviewed: Basic Metabolic Panel:  Recent Labs Lab 05/05/14 1722 05/06/14 0358  NA 138 136*  K 4.0 3.7  CL 103 101  CO2 24 22  GLUCOSE 108* 176*  BUN 15 14  CREATININE 1.29 0.99  CALCIUM 9.3 9.4  MG  --  2.1  PHOS  --  3.3   Liver Function Tests:  Recent Labs Lab 05/06/14 0358  AST 45*  ALT 45  ALKPHOS 407*  BILITOT 0.4  PROT 7.7  ALBUMIN 3.6   No results found for this basename: LIPASE, AMYLASE,  in the last 168 hours No results found for this basename: AMMONIA,  in the last 168 hours CBC:  Recent Labs Lab 05/05/14 1722  05/06/14 0358  WBC 6.2 6.4  NEUTROABS 4.2  --   HGB 14.5 14.5  HCT 41.4 41.6  MCV 83.5 83.7  PLT 198 198   Cardiac Enzymes: No results found for this basename: CKTOTAL, CKMB, CKMBINDEX, TROPONINI,  in the last 168 hours BNP (last 3 results) No results found for this basename: PROBNP,  in the last 8760 hours CBG: No results found for this basename: GLUCAP,  in the last 168 hours  Recent Results (from the past 240 hour(s))  RAPID STREP SCREEN     Status: None   Collection Time    05/05/14  3:10 PM      Result Value Ref Range Status   Streptococcus, Group A Screen (Direct) NEGATIVE  NEGATIVE Final   Comment: (NOTE)     A Rapid Antigen test may result negative if the antigen level in the     sample is below the detection level of this test. The FDA has not     cleared this test as a stand-alone test therefore the rapid antigen     negative result has reflexed to a Group A Strep culture.     Studies: Ct Head Wo Contrast  05/06/2014   CLINICAL DATA:  48 year old male with headache.  Initial encounter.  EXAM:  CT HEAD WITHOUT CONTRAST  TECHNIQUE: Contiguous axial images were obtained from the base of the skull through the vertex without intravenous contrast.  COMPARISON:  Neck CT same date.  FINDINGS: There are at least 3 hyperdense lesions within the brain located within the medial aspect of the posterior frontal lobes and right parietal lobe. Left posterior medial frontal lobe lesion with minimal surrounding vasogenic edema. No mass effect. Given the CT findings, intracranial findings may represent intracranial metastatic disease. Spread of infection not entirely excluded given the miliary appearance of the visualized lungs. Follow-up brain MR with contrast would be helpful for further delineation.  No intracranial hemorrhage separate from the above described findings.  No hydrocephalus.  No CT evidence of large acute infarct.  Remote medial wall fracture right orbit.  IMPRESSION: At least  3 intracranial lesions possibly representing metastatic disease with infection not entirely excluded given the miliary appearance of the visualized upper lung. MR with contrast may be considered for further delineation.   Electronically Signed   By: Chauncey Cruel M.D.   On: 05/06/2014 00:06   Ct Soft Tissue Neck W Contrast  05/05/2014   ADDENDUM REPORT: 05/05/2014 20:33  ADDENDUM: Results were discussed with Dr. Roel Cluck, the possibility of infection such as miliary TB with superimposed active TB is a consideration. Aspiration of the necrotic left level 5 lymph node with culture and cytology may prove helpful for further delineation. Additionally, formal chest CT may be considered.   Electronically Signed   By: Chauncey Cruel M.D.   On: 05/05/2014 20:33   05/05/2014   CLINICAL DATA:  48 year old male presenting with left-sided neck pain for the past month getting worse. Initial encounter.  EXAM: CT NECK WITH CONTRAST  TECHNIQUE: Multidetector CT imaging of the neck was performed using the standard protocol following the bolus administration of intravenous contrast.  CONTRAST:  21mL OMNIPAQUE IOHEXOL 300 MG/ML  SOLN  COMPARISON:  None.  FINDINGS: Left upper lobe 0 4.8 x 4.2 x 3.2 cm mass highly concerning for primary malignancy with diffuse miliary pulmonary metastatic lesions bilaterally. Additionally, neck adenopathy greater on the left and most notable in the left supraclavicular region and left level 5 region where necrotic adenopathy with extracapsular spread is noted.  Small pericardial effusion. Left hilar and subcarinal/pretracheal/AP window adenopathy.  Osseous metastatic disease to the T5 vertebral body suspected.  No primary neck mass is noted. Direct visualization would be necessary to exclude subtle mucosal abnormality.  Limited imaging of intracranial structures without evidence of metastatic disease.  IMPRESSION: Left upper lobe 0 4.8 x 4.2 x 3.2 cm mass highly concerning for primary malignancy with  diffuse miliary pulmonary metastatic lesions bilaterally.  Neck adenopathy greater on the left and most notable in the left supraclavicular region and left level 5 region where necrotic adenopathy with extracapsular spread is noted.  Small pericardial effusion. Left hilar and subcarinal/pretracheal/AP window adenopathy.  Osseous metastatic disease to the T5 vertebral body suspected.  No primary neck mass is noted. Direct visualization would be necessary to exclude subtle mucosal abnormality.  These results were called by telephone at the time of interpretation on 05/05/2014 at 7:04pm to [facility] PA, who verbally acknowledged these results.  Electronically Signed: By: Chauncey Cruel M.D. On: 05/05/2014 19:10    Scheduled Meds: . docusate sodium  100 mg Oral BID   Continuous Infusions:   Active Problems:   Abnormal CT scan, chest   Chest mass    Time spent: 35 minutes.  Niel Hummer A  [facility] Pager [phone]. If 7PM-7AM, please contact night-coverage at [email or web address], password [facility] 05/06/2014, 8:52 AM  LOS: 1 day

## 2014-05-06 NOTE — Progress Notes (Signed)
Pt given hypertonic saline in nebulizer for sputum induction for AFB    Pt had dry cough.  Container placed in room for later.  Explained to pt's daughter what we wanted

## 2014-05-06 NOTE — Progress Notes (Signed)
Have used [employer] today for pt communication.  The specific dialect that the pt uses is [facility] or KARENNI.  There is an interpreter who does speak this dialect through the interpreter line.

## 2014-05-06 NOTE — ED Provider Notes (Signed)
Medical screening examination/treatment/procedure(s) were performed by non-physician practitioner and as supervising physician I was immediately available for consultation/collaboration.  Richarda Blade, MD 05/06/14 [phone]

## 2014-05-07 ENCOUNTER — Inpatient Hospital Stay (HOSPITAL_COMMUNITY): Payer: BC Managed Care – PPO

## 2014-05-07 LAB — CULTURE, GROUP A STREP

## 2014-05-07 MED ORDER — MIDAZOLAM HCL 2 MG/2ML IJ SOLN
INTRAMUSCULAR | Status: AC
Start: 2014-05-07 — End: 2014-05-08
  Filled 2014-05-07: qty 2

## 2014-05-07 MED ORDER — FENTANYL CITRATE 0.05 MG/ML IJ SOLN
INTRAMUSCULAR | Status: AC
  Filled 2014-05-07: qty 2

## 2014-05-07 MED ORDER — LIDOCAINE HCL (PF) 1 % IJ SOLN
INTRAMUSCULAR | Status: AC
  Filled 2014-05-07: qty 10

## 2014-05-07 MED ORDER — MIDAZOLAM HCL 2 MG/2ML IJ SOLN
INTRAMUSCULAR | Status: AC | PRN
  Administered 2014-05-07: 1 mg via INTRAVENOUS

## 2014-05-07 MED ORDER — FENTANYL CITRATE 0.05 MG/ML IJ SOLN
INTRAMUSCULAR | Status: AC | PRN
  Administered 2014-05-07: 50 ug via INTRAVENOUS

## 2014-05-07 NOTE — Progress Notes (Signed)
[facility]    Date of Admission:  05/05/2014   Total days of antibiotics 0   ID: Race Latour is a 48 y.o. male originally from [place] found to have lung and brain/leptomengeal enhancement concerning for infectious process such as mtb or fungal infection vs. malignancy  Active Problems:   Abnormal CT scan, chest   Chest mass    Subjective: Afebrile, mild pain with swallowing, mild frontal headache, denies having cough  Medications:  . docusate sodium  100 mg Oral BID    Objective: Vital signs in last 24 hours: Temp:  [98.5 F (36.9 C)-99 F (37.2 C)] 98.6 F (37 C) (10/19 1039) Pulse Rate:  [66-79] 66 (10/19 1039) Resp:  [17-18] 17 (10/19 1039) BP: (104-124)/(68-80) 104/76 mmHg (10/19 1039) SpO2:  [97 %-99 %] 97 % (10/19 1039) gen = a xo by 3 in nad Pulm= ctab, no w/c/r Cors =nl s1,s2 ,no w/c/r Abd= soft, ntnd, +bs Ext= no c/c/e Skin = no rash   Lab Results  Recent Labs  05/05/14 1722 05/06/14 0358  WBC 6.2 6.4  HGB 14.5 14.5  HCT 41.4 41.6  NA 138 136*  K 4.0 3.7  CL 103 101  CO2 24 22  BUN 15 14  CREATININE 1.29 0.99   Liver Panel  Recent Labs  05/06/14 0358  PROT 7.7  ALBUMIN 3.6  AST 45*  ALT 45  ALKPHOS 407*  BILITOT 0.4   Sedimentation Rate No results found for this basename: ESRSEDRATE,  in the last 72 hours C-Reactive Protein No results found for this basename: CRP,  in the last 72 hours  Microbiology: Hiv/hep b/hep c negative quantiferon pending Studies/Results: Ct Head Wo Contrast  05/06/2014   CLINICAL DATA:  48 year old male with headache.  Initial encounter.  EXAM: CT HEAD WITHOUT CONTRAST  TECHNIQUE: Contiguous axial images were obtained from the base of the skull through the vertex without intravenous contrast.  COMPARISON:  Neck CT same date.  FINDINGS: There are at least 3 hyperdense lesions within the brain located within the medial aspect of the posterior frontal lobes and right parietal lobe.  Left posterior medial frontal lobe lesion with minimal surrounding vasogenic edema. No mass effect. Given the CT findings, intracranial findings may represent intracranial metastatic disease. Spread of infection not entirely excluded given the miliary appearance of the visualized lungs. Follow-up brain MR with contrast would be helpful for further delineation.  No intracranial hemorrhage separate from the above described findings.  No hydrocephalus.  No CT evidence of large acute infarct.  Remote medial wall fracture right orbit.  IMPRESSION: At least 3 intracranial lesions possibly representing metastatic disease with infection not entirely excluded given the miliary appearance of the visualized upper lung. MR with contrast may be considered for further delineation.   Electronically Signed   By: Chauncey Cruel M.D.   On: 05/06/2014 00:06   Ct Soft Tissue Neck W Contrast  05/05/2014   ADDENDUM REPORT: 05/05/2014 20:33  ADDENDUM: Results were discussed with Dr. Roel Cluck, the possibility of infection such as miliary TB with superimposed active TB is a consideration. Aspiration of the necrotic left level 5 lymph node with culture and cytology may prove helpful for further delineation. Additionally, formal chest CT may be considered.   Electronically Signed   By: Chauncey Cruel M.D.   On: 05/05/2014 20:33   05/05/2014   CLINICAL DATA:  48 year old male presenting with left-sided neck pain for the past month getting worse. Initial encounter.  EXAM: CT NECK WITH CONTRAST  TECHNIQUE: Multidetector CT imaging of the neck was performed using the standard protocol following the bolus administration of intravenous contrast.  CONTRAST:  67mL OMNIPAQUE IOHEXOL 300 MG/ML  SOLN  COMPARISON:  None.  FINDINGS: Left upper lobe 0 4.8 x 4.2 x 3.2 cm mass highly concerning for primary malignancy with diffuse miliary pulmonary metastatic lesions bilaterally. Additionally, neck adenopathy greater on the left and most notable in the left  supraclavicular region and left level 5 region where necrotic adenopathy with extracapsular spread is noted.  Small pericardial effusion. Left hilar and subcarinal/pretracheal/AP window adenopathy.  Osseous metastatic disease to the T5 vertebral body suspected.  No primary neck mass is noted. Direct visualization would be necessary to exclude subtle mucosal abnormality.  Limited imaging of intracranial structures without evidence of metastatic disease.  IMPRESSION: Left upper lobe 0 4.8 x 4.2 x 3.2 cm mass highly concerning for primary malignancy with diffuse miliary pulmonary metastatic lesions bilaterally.  Neck adenopathy greater on the left and most notable in the left supraclavicular region and left level 5 region where necrotic adenopathy with extracapsular spread is noted.  Small pericardial effusion. Left hilar and subcarinal/pretracheal/AP window adenopathy.  Osseous metastatic disease to the T5 vertebral body suspected.  No primary neck mass is noted. Direct visualization would be necessary to exclude subtle mucosal abnormality.  These results were called by telephone at the time of interpretation on 05/05/2014 at 7:04pm to [facility], who verbally acknowledged these results.  Electronically Signed: By: Chauncey Cruel M.D. On: 05/05/2014 19:10   Mr Jeri Cos BZ Contrast  05/06/2014   CLINICAL DATA:  Abnormal head CT worrisome for metastatic disease or cerebritis. Neck swelling. Diffuse pulmonary disease. Some headache.  EXAM: MRI HEAD WITHOUT AND WITH CONTRAST  TECHNIQUE: Multiplanar, multiecho pulse sequences of the brain and surrounding structures were obtained without and with intravenous contrast.  CONTRAST:  87mL MULTIHANCE GADOBENATE DIMEGLUMINE 529 MG/ML IV SOLN  COMPARISON:  Head CT 05/05/2014 there is no evidence of old or acute infarction.  The brainstem and cerebellum are normal. There is a 13 x 12 mm mass lesion in the left parietal gray-white junction region with mild surrounding edema.  This is slightly hemorrhagic. This is most consistent with a metastasis. There is a 7 mm in diameter enhancing lesion at the medial right parietal vertex. Very minimal petechial hemorrhage within this lesion. There is a 4 mm enhancing lesion in the right basal ganglia without hemorrhage or edema. There is leptomeningeal enhancement in the right parieto-occipital junction region without visible underlying brain abnormality.  Ventricular size is normal. No appendicolith enhancement. No extra-axial fluid collection. No pituitary mass. No inflammatory sinus disease. There are marrow space lesions in the clivus consistent with metastatic disease.  FINDINGS: Three enhancing brain masses consistent with metastatic disease. 12 x 13 mm left parietal lesion. 7 mm right parietal lesion. 4 mm right basal ganglia lesion. Leptomeninges L enhancement along the surface at the right parieto-occipital junction worrisome for leptomeningeal carcinomatosis. Marrow space lesions of the clivus consistent with metastatic disease to that region.  Clinically, there is also some concern regarding the possibility of disseminated tuberculosis or fungal infection. That possibility does exist but seems considerably less likely given this pattern of disease.   Electronically Signed   By: Nelson Chimes M.D.   On: 05/06/2014 17:32     Assessment/Plan: Patient undergoing IR biopsy of Left cervical LAN in order to send for AFB, fungal, bacterial and s well  as cytology to help with diagnosis. For now keep in airborne isolation  [place], [facility] for Infectious Diseases Cell: [phone] Pager: [phone]  05/07/2014, 11:56 AM

## 2014-05-07 NOTE — Procedures (Signed)
Interventional Radiology Procedure Note  Procedure: US guided biopsy of left neck lesion.  Findings.  Multiple abnormal lymph nodes on left neck.  Bx of the largest.  Complications: No immediate Recommendations:  Routine dressing care. Follow up pathology  Signed,  Dulcy Fanny. Earleen Newport, DO

## 2014-05-07 NOTE — Sedation Documentation (Signed)
Biopsy obtained and sent to lab. Pt tolerated well.

## 2014-05-07 NOTE — Progress Notes (Signed)
[facility] PROGRESS NOTE  Russell Stanley VOZ:[id number] DOB: July 09, 1966 DOA: 05/05/2014 PCP: No primary provider on file.  Assessment/Plan: 1-Neck Lymphadenopathy, left upper lobe Mass, 3 intracranial lesions ;  -For lymph node biopsy 10-19: Culture for bacterial, AFB, fungal ordered. Fungal and AFB stain ordered. Pathology.  -MRI brain : Three enhancing brain masses consistent with metastatic disease. 12 x 13 mm left parietal lesion. 7 mm right parietal lesion. 4 mm right  basal ganglia lesion. Leptomeninges L enhancement along the surface at the right parieto-occipital junction worrisome for leptomeningeal carcinomatosis. Marrow space lesions of the clivus consistent with metastatic disease to that region.  -LDH 271.  -quantiferon sent -HIV negative, hepatitis panel negative.  -Sputum for AFB, culture ordered.   -TSH; 1.5    language/  Loistine Simas  Code Status: Full Code.  Family Communication: Care discussed with patient.  Disposition Plan: Remain in the hospital.    Consultants:  ID, phone consultation.   Procedures:  US guide biopsy neck mass.   Antibiotics:  none  HPI/Subjective: Use translator; Russell Stanley Kitchen Does relates occasional cough. No pain.   Objective: Filed Vitals:   05/07/14 0500  BP: 108/74  Pulse: 70  Temp: 98.6 F (37 C)  Resp: 18    Intake/Output Summary (Last 24 hours) at 05/07/14 0834 Last data filed at 05/07/14 0654  Gross per 24 hour  Intake    590 ml  Output   3050 ml  Net  -2460 ml   Filed Weights   05/06/14 0010  Weight: 71.079 kg (156 lb 11.2 oz)    Exam:   General:  Alert, in no distress.   Cardiovascular: S 1, S 2 RRR  Respiratory: CTA  Abdomen: Bs present, soft, NT  Musculoskeletal: no edema.    Data Reviewed: Basic Metabolic Panel:  Recent Labs Lab 05/05/14 1722 05/06/14 0358  NA 138 136*  K 4.0 3.7  CL 103 101  CO2 24 22  GLUCOSE 108* 176*  BUN 15 14  CREATININE 1.29 0.99  CALCIUM 9.3 9.4  MG  --  2.1  PHOS   --  3.3   Liver Function Tests:  Recent Labs Lab 05/06/14 0358  AST 45*  ALT 45  ALKPHOS 407*  BILITOT 0.4  PROT 7.7  ALBUMIN 3.6   No results found for this basename: LIPASE, AMYLASE,  in the last 168 hours No results found for this basename: AMMONIA,  in the last 168 hours CBC:  Recent Labs Lab 05/05/14 1722 05/06/14 0358  WBC 6.2 6.4  NEUTROABS 4.2  --   HGB 14.5 14.5  HCT 41.4 41.6  MCV 83.5 83.7  PLT 198 198   Cardiac Enzymes: No results found for this basename: CKTOTAL, CKMB, CKMBINDEX, TROPONINI,  in the last 168 hours BNP (last 3 results) No results found for this basename: PROBNP,  in the last 8760 hours CBG: No results found for this basename: GLUCAP,  in the last 168 hours  Recent Results (from the past 240 hour(s))  RAPID STREP SCREEN     Status: None   Collection Time    05/05/14  3:10 PM      Result Value Ref Range Status   Streptococcus, Group A Screen (Direct) NEGATIVE  NEGATIVE Final   Comment: (NOTE)     A Rapid Antigen test may result negative if the antigen level in the     sample is below the detection level of this test. The FDA has not     cleared this test as  a stand-alone test therefore the rapid antigen     negative result has reflexed to a Group A Strep culture.     Studies: Ct Head Wo Contrast  05/06/2014   CLINICAL DATA:  48 year old male with headache.  Initial encounter.  EXAM: CT HEAD WITHOUT CONTRAST  TECHNIQUE: Contiguous axial images were obtained from the base of the skull through the vertex without intravenous contrast.  COMPARISON:  Neck CT same date.  FINDINGS: There are at least 3 hyperdense lesions within the brain located within the medial aspect of the posterior frontal lobes and right parietal lobe. Left posterior medial frontal lobe lesion with minimal surrounding vasogenic edema. No mass effect. Given the CT findings, intracranial findings may represent intracranial metastatic disease. Spread of infection not entirely  excluded given the miliary appearance of the visualized lungs. Follow-up brain MR with contrast would be helpful for further delineation.  No intracranial hemorrhage separate from the above described findings.  No hydrocephalus.  No CT evidence of large acute infarct.  Remote medial wall fracture right orbit.  IMPRESSION: At least 3 intracranial lesions possibly representing metastatic disease with infection not entirely excluded given the miliary appearance of the visualized upper lung. MR with contrast may be considered for further delineation.   Electronically Signed   By: Chauncey Cruel M.D.   On: 05/06/2014 00:06   Ct Soft Tissue Neck W Contrast  05/05/2014   ADDENDUM REPORT: 05/05/2014 20:33  ADDENDUM: Results were discussed with Dr. Roel Cluck, the possibility of infection such as miliary TB with superimposed active TB is a consideration. Aspiration of the necrotic left level 5 lymph node with culture and cytology may prove helpful for further delineation. Additionally, formal chest CT may be considered.   Electronically Signed   By: Chauncey Cruel M.D.   On: 05/05/2014 20:33   05/05/2014   CLINICAL DATA:  48 year old male presenting with left-sided neck pain for the past month getting worse. Initial encounter.  EXAM: CT NECK WITH CONTRAST  TECHNIQUE: Multidetector CT imaging of the neck was performed using the standard protocol following the bolus administration of intravenous contrast.  CONTRAST:  69mL OMNIPAQUE IOHEXOL 300 MG/ML  SOLN  COMPARISON:  None.  FINDINGS: Left upper lobe 0 4.8 x 4.2 x 3.2 cm mass highly concerning for primary malignancy with diffuse miliary pulmonary metastatic lesions bilaterally. Additionally, neck adenopathy greater on the left and most notable in the left supraclavicular region and left level 5 region where necrotic adenopathy with extracapsular spread is noted.  Small pericardial effusion. Left hilar and subcarinal/pretracheal/AP window adenopathy.  Osseous metastatic disease  to the T5 vertebral body suspected.  No primary neck mass is noted. Direct visualization would be necessary to exclude subtle mucosal abnormality.  Limited imaging of intracranial structures without evidence of metastatic disease.  IMPRESSION: Left upper lobe 0 4.8 x 4.2 x 3.2 cm mass highly concerning for primary malignancy with diffuse miliary pulmonary metastatic lesions bilaterally.  Neck adenopathy greater on the left and most notable in the left supraclavicular region and left level 5 region where necrotic adenopathy with extracapsular spread is noted.  Small pericardial effusion. Left hilar and subcarinal/pretracheal/AP window adenopathy.  Osseous metastatic disease to the T5 vertebral body suspected.  No primary neck mass is noted. Direct visualization would be necessary to exclude subtle mucosal abnormality.  These results were called by telephone at the time of interpretation on 05/05/2014 at 7:04pm to [facility] PA, who verbally acknowledged these results.  Electronically Signed: By: Chauncey Cruel  M.D. On: 05/05/2014 19:10   Mr Jeri Cos RR Contrast  05/06/2014   CLINICAL DATA:  Abnormal head CT worrisome for metastatic disease or cerebritis. Neck swelling. Diffuse pulmonary disease. Some headache.  EXAM: MRI HEAD WITHOUT AND WITH CONTRAST  TECHNIQUE: Multiplanar, multiecho pulse sequences of the brain and surrounding structures were obtained without and with intravenous contrast.  CONTRAST:  6mL MULTIHANCE GADOBENATE DIMEGLUMINE 529 MG/ML IV SOLN  COMPARISON:  Head CT 05/05/2014 there is no evidence of old or acute infarction.  The brainstem and cerebellum are normal. There is a 13 x 12 mm mass lesion in the left parietal gray-white junction region with mild surrounding edema. This is slightly hemorrhagic. This is most consistent with a metastasis. There is a 7 mm in diameter enhancing lesion at the medial right parietal vertex. Very minimal petechial hemorrhage within this lesion. There is a 4 mm  enhancing lesion in the right basal ganglia without hemorrhage or edema. There is leptomeningeal enhancement in the right parieto-occipital junction region without visible underlying brain abnormality.  Ventricular size is normal. No appendicolith enhancement. No extra-axial fluid collection. No pituitary mass. No inflammatory sinus disease. There are marrow space lesions in the clivus consistent with metastatic disease.  FINDINGS: Three enhancing brain masses consistent with metastatic disease. 12 x 13 mm left parietal lesion. 7 mm right parietal lesion. 4 mm right basal ganglia lesion. Leptomeninges L enhancement along the surface at the right parieto-occipital junction worrisome for leptomeningeal carcinomatosis. Marrow space lesions of the clivus consistent with metastatic disease to that region.  Clinically, there is also some concern regarding the possibility of disseminated tuberculosis or fungal infection. That possibility does exist but seems considerably less likely given this pattern of disease.   Electronically Signed   By: Nelson Chimes M.D.   On: 05/06/2014 17:32    Scheduled Meds: . docusate sodium  100 mg Oral BID   Continuous Infusions: . sodium chloride 100 mL/hr at 05/07/14 0006    Active Problems:   Abnormal CT scan, chest   Chest mass    Time spent: 35 minutes.     Niel Hummer A  [facility] Pager [phone]. If 7PM-7AM, please contact night-coverage at [email or web address], password [facility] 05/07/2014, 8:34 AM  LOS: 2 days

## 2014-05-07 NOTE — Sedation Documentation (Signed)
Paient is from [place] and we are communicating with phone interpreter to the patient. Patient agrees with procedures.

## 2014-05-07 NOTE — Care Management Note (Signed)
  Page 1 of 1   05/07/2014     11:28:19 AM CARE MANAGEMENT NOTE 05/07/2014  Patient:  Russell Stanley, Russell Stanley   Account Number:  [id number]  Date Initiated:  05/07/2014  Documentation initiated by:  Magdalen Spatz  Subjective/Objective Assessment:     Action/Plan:   Anticipated DC Date:     Anticipated DC Plan:           Choice offered to / List presented to:             Status of service:   Medicare Important Message given?   (If response is "NO", the following Medicare IM given date fields will be blank) Date Medicare IM given:   Medicare IM given by:   Date Additional Medicare IM given:   Additional Medicare IM given by:    Discharge Disposition:    Per UR Regulation:    If discussed at Long Length of Stay Meetings, dates discussed:    Comments:    05-07-14 Refrral for LT[facility] ( [facility] or [facility]) , patient has Medicaid for insurance . Medicaid does not have LTAC benefit. Magdalen Spatz RN BSN [phone]

## 2014-05-08 LAB — BASIC METABOLIC PANEL
Anion gap: 15 (ref 5–15)
BUN: 13 mg/dL (ref 6–23)
CO2: 19 mEq/L (ref 19–32)
Calcium: 9.2 mg/dL (ref 8.4–10.5)
Chloride: 101 mEq/L (ref 96–112)
Creatinine, Ser: 0.84 mg/dL (ref 0.50–1.35)
GFR calc Af Amer: >90 mL/min (ref >90)
Glucose, Bld: 89 mg/dL (ref 70–99)
POTASSIUM: 4 meq/L (ref 3.7–5.3)
SODIUM: 135 meq/L — AB (ref 137–147)

## 2014-05-08 LAB — QUANTIFERON TB GOLD ASSAY (BLOOD)
Interferon Gamma Release Assay: POSITIVE — AB
Mitogen value: 3.74 IU/mL
QUANTIFERON NIL VALUE: 0.11 [IU]/mL
TB AG VALUE: 0.72 [IU]/mL
TB Antigen Minus Nil Value: 0.61 IU/mL

## 2014-05-08 NOTE — Progress Notes (Signed)
[facility] PROGRESS NOTE  Russell Stanley BMW:[id number] DOB: Nov 18, 1965 DOA: 05/05/2014 PCP: No primary provider on file.  Assessment/Plan: Russell Stanley is a 48 y.o. male has no past medical history on file. Presented with neck swelling for the past 4 months. Patient Has immigrated from [place] 4 years ago. He does not speak english, history obtain through the interpreter. Patient was evaluated in ER and was found to have firm left neck lymphadenopathy. CT of the neck was ordered showing large necrosing neck lymphadenopathy, Left upper lobe 0 4.8 x 4.2 x 3.2 cm mass highly concerning for primary malignancy with diffuse miliary pulmonary metastatic lesions bilaterally. Ct head ; At least 3 intracranial lesions possibly representing metastatic disease with infection not entirely excluded.  No history of fever, weight loss but has occasional chills.Occasional cough non-productive.  1-Neck Lymphadenopathy, left upper lobe Mass, 3 intracranial lesions ;  -S/P  lymph node biopsy 10-19: Culture for bacterial, AFB, fungal pending. Fungal and AFB stain pending. . Pathology pending.  -MRI brain : Three enhancing brain masses consistent with metastatic disease. 12 x 13 mm left parietal lesion. 7 mm right parietal lesion. 4 mm right  basal ganglia lesion. Leptomeninges L enhancement along the surface at the right parieto-occipital junction worrisome for leptomeningeal carcinomatosis. Marrow space lesions of the clivus consistent with metastatic disease to that region.  -LDH 271.  -quantiferon pending.  -HIV negative, hepatitis panel negative negative -Sputum for AFB, culture ordered.   -TSH; 1.5 -ID following.    language/  Russell Stanley  Code Status: Full Code.  Family Communication: Care discussed with patient.  Disposition Plan: Remain in the hospital.    Consultants:  ID, phone consultation.   Procedures:  US guide biopsy neck mass.   Antibiotics:  none  HPI/Subjective: Use  translator; no new complaints. Feeling well.   Objective: Filed Vitals:   05/08/14 1015  BP: 110/75  Pulse: 77  Temp: 98 F (36.7 C)  Resp: 16    Intake/Output Summary (Last 24 hours) at 05/08/14 1445 Last data filed at 05/08/14 1300  Gross per 24 hour  Intake   1823 ml  Output   3050 ml  Net  -1227 ml   Filed Weights   05/06/14 0010  Weight: 71.079 kg (156 lb 11.2 oz)    Exam:   General:  Alert, in no distress.   Cardiovascular: S 1, S 2 RRR  Respiratory: CTA  Abdomen: Bs present, soft, NT  Musculoskeletal: no edema.    Data Reviewed: Basic Metabolic Panel:  Recent Labs Lab 05/05/14 1722 05/06/14 0358 05/08/14 0525  NA 138 136* 135*  K 4.0 3.7 4.0  CL 103 101 101  CO2 24 22 19   GLUCOSE 108* 176* 89  BUN 15 14 13   CREATININE 1.29 0.99 0.84  CALCIUM 9.3 9.4 9.2  MG  --  2.1  --   PHOS  --  3.3  --    Liver Function Tests:  Recent Labs Lab 05/06/14 0358  AST 45*  ALT 45  ALKPHOS 407*  BILITOT 0.4  PROT 7.7  ALBUMIN 3.6   No results found for this basename: LIPASE, AMYLASE,  in the last 168 hours No results found for this basename: AMMONIA,  in the last 168 hours CBC:  Recent Labs Lab 05/05/14 1722 05/06/14 0358  WBC 6.2 6.4  NEUTROABS 4.2  --   HGB 14.5 14.5  HCT 41.4 41.6  MCV 83.5 83.7  PLT 198 198   Cardiac Enzymes: No results  found for this basename: CKTOTAL, CKMB, CKMBINDEX, TROPONINI,  in the last 168 hours BNP (last 3 results) No results found for this basename: PROBNP,  in the last 8760 hours CBG: No results found for this basename: GLUCAP,  in the last 168 hours  Recent Results (from the past 240 hour(s))  RAPID STREP SCREEN     Status: None   Collection Time    05/05/14  3:10 PM      Result Value Ref Range Status   Streptococcus, Group A Screen (Direct) NEGATIVE  NEGATIVE Final   Comment: (NOTE)     A Rapid Antigen test may result negative if the antigen level in the     sample is below the detection level of  this test. The FDA has not     cleared this test as a stand-alone test therefore the rapid antigen     negative result has reflexed to a Group A Strep culture.  CULTURE, GROUP A STREP     Status: None   Collection Time    05/05/14  3:10 PM      Result Value Ref Range Status   Specimen Description THROAT   Final   Special Requests NONE   Final   Culture     Final   Value: No Beta Hemolytic Streptococci Isolated     Performed at [facility]   Report Status 05/07/2014 FINAL   Final  FUNGUS CULTURE W SMEAR     Status: None   Collection Time    05/07/14  4:37 PM      Result Value Ref Range Status   Specimen Description TISSUE LYMPH NODE   Final   Special Requests Immunocompromised   Final   Fungal Smear     Final   Value: NO YEAST OR FUNGAL ELEMENTS SEEN     Performed at [facility]   Culture     Final   Value: CULTURE IN PROGRESS FOR FOUR WEEKS     Performed at [facility]   Report Status PENDING   Incomplete  TISSUE CULTURE     Status: None   Collection Time    05/07/14  4:37 PM      Result Value Ref Range Status   Specimen Description TISSUE LYMPH NODE   Final   Special Requests NONE   Final   Gram Stain     Final   Value: NO WBC SEEN     NO ORGANISMS SEEN     Performed at [facility]   Culture     Final   Value: NO GROWTH     Performed at [facility]   Report Status PENDING   Incomplete     Studies: Mr Jeri Cos Wo Contrast  May 21, 2014   CLINICAL DATA:  Abnormal head CT worrisome for metastatic disease or cerebritis. Neck swelling. Diffuse pulmonary disease. Some headache.  EXAM: MRI HEAD WITHOUT AND WITH CONTRAST  TECHNIQUE: Multiplanar, multiecho pulse sequences of the brain and surrounding structures were obtained without and with intravenous contrast.  CONTRAST:  75mL MULTIHANCE GADOBENATE DIMEGLUMINE 529 MG/ML IV SOLN  COMPARISON:  Head CT 05/05/2014 there is no evidence of old or acute infarction.  The brainstem and  cerebellum are normal. There is a 13 x 12 mm mass lesion in the left parietal gray-white junction region with mild surrounding edema. This is slightly hemorrhagic. This is most consistent with a metastasis. There is a 7 mm in diameter enhancing lesion at  the medial right parietal vertex. Very minimal petechial hemorrhage within this lesion. There is a 4 mm enhancing lesion in the right basal ganglia without hemorrhage or edema. There is leptomeningeal enhancement in the right parieto-occipital junction region without visible underlying brain abnormality.  Ventricular size is normal. No appendicolith enhancement. No extra-axial fluid collection. No pituitary mass. No inflammatory sinus disease. There are marrow space lesions in the clivus consistent with metastatic disease.  FINDINGS: Three enhancing brain masses consistent with metastatic disease. 12 x 13 mm left parietal lesion. 7 mm right parietal lesion. 4 mm right basal ganglia lesion. Leptomeninges L enhancement along the surface at the right parieto-occipital junction worrisome for leptomeningeal carcinomatosis. Marrow space lesions of the clivus consistent with metastatic disease to that region.  Clinically, there is also some concern regarding the possibility of disseminated tuberculosis or fungal infection. That possibility does exist but seems considerably less likely given this pattern of disease.   Electronically Signed   By: Nelson Chimes M.D.   On: 05/06/2014 17:32   US Biopsy  05/07/2014   CLINICAL DATA:  48 year old male, immigrant from [place]. Recently discovered to have lymphadenopathy of the neck as well as lung mass. He has been referred for percutaneous biopsy.  EXAM: ULTRASOUND GUIDED CORE BIOPSY OF NECK MASS  MEDICATIONS: 1.0 mg IV Versed; 50 mcg IV Fentanyl  Total Moderate Sedation Time: 10  PROCEDURE: The procedure, risks, benefits, and alternatives were explained to the patient. Questions regarding the procedure were encouraged and  answered. The patient understands and consents to the procedure.  Ultrasound survey of the left neck was performed with images stored and sent to PACs.  The left neck was prepped with Betadine in a sterile fashion, and a sterile drape was applied covering the operative field. A sterile gown and sterile gloves were used for the procedure. Local anesthesia was provided with 1% Lidocaine.  Infiltration of the skin and subcutaneous tissues was performed with 1% lidocaine for local anesthesia using ultrasound guidance.  An 18 gauge core biopsy gun was then advanced under ultrasound guidance into left-sided enlarged lymph node. Four separate 18 gauge core biopsy were performed.  Images were stored during each needle pass. A sterile bandage was placed.  Patient tolerated the procedure well and remained hemodynamically stable throughout.  No complications were encountered and no significant blood loss was encounter.  COMPLICATIONS: None.  FINDINGS: Multiple left-sided lymph nodes, with the largest biopsied with 4 separate 18 gauge core biopsy.  IMPRESSION: Status post percutaneous biopsy of left-sided lymph node, with tissue specimen sent to pathology for complete histopathologic analysis.  Signed,  Dulcy Fanny. Earleen Newport, DO  [facility]  [facility]   Electronically Signed   By: Corrie Mckusick D.O.   On: 05/07/2014 17:19    Scheduled Meds: . docusate sodium  100 mg Oral BID   Continuous Infusions: . sodium chloride 100 mL/hr at 05/08/14 4174    Active Problems:   Abnormal CT scan, chest   Chest mass    Time spent: 30 minutes.     Niel Hummer A  [facility] Pager [phone]. If 7PM-7AM, please contact night-coverage at [email or web address], password [facility] 05/08/2014, 2:45 PM  LOS: 3 days

## 2014-05-09 ENCOUNTER — Inpatient Hospital Stay (HOSPITAL_COMMUNITY): Payer: BC Managed Care – PPO

## 2014-05-09 MED ORDER — DEXAMETHASONE SODIUM PHOSPHATE 4 MG/ML IJ SOLN
4.0000 mg | Freq: Four times a day (QID) | INTRAMUSCULAR | Status: DC
  Administered 2014-05-09 – 2014-05-12 (×13): 4 mg via INTRAVENOUS
  Filled 2014-05-09 (×16): qty 1

## 2014-05-09 MED ORDER — PANTOPRAZOLE SODIUM 40 MG PO TBEC
40.0000 mg | DELAYED_RELEASE_TABLET | Freq: Every day | ORAL | Status: DC
  Administered 2014-05-10 – 2014-05-12 (×3): 40 mg via ORAL
  Filled 2014-05-09 (×3): qty 1

## 2014-05-09 NOTE — Progress Notes (Addendum)
[facility] PROGRESS NOTE  Russell Stanley WPV:[id number] DOB: [birth year] DOA: 05/05/2014 PCP: No primary provider on file.  Summary  Russell Stanley is a 48 y.o. male has no past medical history on file. Presented with neck swelling for the past 4 months. Patient Has immigrated from [place] 4 years ago. He does not speak english, history obtain through the interpreter. Patient was evaluated in ER and was found to have firm left neck lymphadenopathy. CT of the neck was ordered showing large necrosing neck lymphadenopathy, Left upper lobe 0 4.8 x 4.2 x 3.2 cm mass highly concerning for primary malignancy with diffuse miliary pulmonary metastatic lesions bilaterally. Ct head ; At least 3 intracranial lesions possibly representing metastatic disease with infection not entirely excluded. No history of fever, weight loss but has occasional chills.Occasional cough non-productive    Assessment/Plan:  1-Neck Lymphadenopathy, left upper lobe Mass, 3 intracranial lesions ;   - Suspicious clinically for tuberculosis versus malignancy. He is status post left neck needle guided biopsy by IR, pathology results pending. So far AFB smear has been negative. LDH is elevated at 271. Quanteferon is positive. Negative HIV and hepatitis panel. Discussed his case with ID physician Dr. Graylon Good. We will await for lymph node biopsy results before further testing. Continue airborne precautions.   Addendum. 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 Chest CT ordered. Will request oncology input.    language/  Loistine Simas  Code Status: Full Code.  Family Communication: Care discussed with patient.  Disposition Plan: Remain in the hospital.    Consultants:  ID, IR on oncology  Procedures:  US guide biopsy neck mass.   Antibiotics:  none  HPI/Subjective: In bed eating breakfast, denies any headache, no chest pain shortness of breath. No abdominal pain or  focal weakness.  Objective: Filed Vitals:   05/09/14 0523  BP: 124/94  Pulse: 59  Temp: 97.9 F (36.6 C)  Resp: 16    Intake/Output Summary (Last 24 hours) at 05/09/14 1024 Last data filed at 05/09/14 0801  Gross per 24 hour  Intake 2914.34 ml  Output   4000 ml  Net -1085.66 ml   Filed Weights   05/06/14 0010 05/09/14 0801  Weight: 71.079 kg (156 lb 11.2 oz) 71.079 kg (156 lb 11.2 oz)    Exam:   General:  Alert, in no distress.   Cardiovascular: S 1, S 2 RRR  Respiratory: CTA  Abdomen: Bs present, soft, NT  Musculoskeletal: no edema.    Data Reviewed: Basic Metabolic Panel:  Recent Labs Lab 05/05/14 1722 05/06/14 0358 05/08/14 0525  NA 138 136* 135*  K 4.0 3.7 4.0  CL 103 101 101  CO2 24 22 19   GLUCOSE 108* 176* 89  BUN 15 14 13   CREATININE 1.29 0.99 0.84  CALCIUM 9.3 9.4 9.2  MG  --  2.1  --   PHOS  --  3.3  --    Liver Function Tests:  Recent Labs Lab 05/06/14 0358  AST 45*  ALT 45  ALKPHOS 407*  BILITOT 0.4  PROT 7.7  ALBUMIN 3.6   No results found for this basename: LIPASE, AMYLASE,  in the last 168 hours No results found for this basename: AMMONIA,  in the last 168 hours CBC:  Recent Labs Lab 05/05/14 1722 05/06/14 0358  WBC 6.2 6.4  NEUTROABS 4.2  --   HGB 14.5 14.5  HCT 41.4 41.6  MCV 83.5 83.7  PLT 198 198   Cardiac  Enzymes: No results found for this basename: CKTOTAL, CKMB, CKMBINDEX, TROPONINI,  in the last 168 hours BNP (last 3 results) No results found for this basename: PROBNP,  in the last 8760 hours CBG: No results found for this basename: GLUCAP,  in the last 168 hours  Recent Results (from the past 240 hour(s))  RAPID STREP SCREEN     Status: None   Collection Time    05/05/14  3:10 PM      Result Value Ref Range Status   Streptococcus, Group A Screen (Direct) NEGATIVE  NEGATIVE Final   Comment: (NOTE)     A Rapid Antigen test may result negative if the antigen level in the     sample is below the  detection level of this test. The FDA has not     cleared this test as a stand-alone test therefore the rapid antigen     negative result has reflexed to a Group A Strep culture.  CULTURE, GROUP A STREP     Status: None   Collection Time    05/05/14  3:10 PM      Result Value Ref Range Status   Specimen Description THROAT   Final   Special Requests NONE   Final   Culture     Final   Value: No Beta Hemolytic Streptococci Isolated     Performed at [facility]   Report Status 05/07/2014 FINAL   Final  AFB CULTURE WITH SMEAR     Status: None   Collection Time    05/07/14  4:37 PM      Result Value Ref Range Status   Specimen Description TISSUE LYMPH NODE   Final   Special Requests Immunocompromised   Final   Acid Fast Smear     Final   Value: NO ACID FAST BACILLI SEEN     Performed at [facility]   Culture     Final   Value: CULTURE WILL BE EXAMINED FOR 6 WEEKS BEFORE ISSUING A FINAL REPORT     Performed at [facility]   Report Status PENDING   Incomplete  FUNGUS CULTURE W SMEAR     Status: None   Collection Time    05/07/14  4:37 PM      Result Value Ref Range Status   Specimen Description TISSUE LYMPH NODE   Final   Special Requests Immunocompromised   Final   Fungal Smear     Final   Value: NO YEAST OR FUNGAL ELEMENTS SEEN     Performed at [facility]   Culture     Final   Value: CULTURE IN PROGRESS FOR FOUR WEEKS     Performed at [facility]   Report Status PENDING   Incomplete  TISSUE CULTURE     Status: None   Collection Time    05/07/14  4:37 PM      Result Value Ref Range Status   Specimen Description TISSUE LYMPH NODE   Final   Special Requests NONE   Final   Gram Stain     Final   Value: NO WBC SEEN     NO ORGANISMS SEEN     Performed at [facility]   Culture     Final   Value: NO GROWTH 1 DAY     Performed at [facility]   Report Status PENDING   Incomplete     Studies: US  Biopsy  05/07/2014   CLINICAL  DATA:  48 year old male, immigrant from [place]. Recently discovered to have lymphadenopathy of the neck as well as lung mass. He has been referred for percutaneous biopsy.  EXAM: ULTRASOUND GUIDED CORE BIOPSY OF NECK MASS  MEDICATIONS: 1.0 mg IV Versed; 50 mcg IV Fentanyl  Total Moderate Sedation Time: 10  PROCEDURE: The procedure, risks, benefits, and alternatives were explained to the patient. Questions regarding the procedure were encouraged and answered. The patient understands and consents to the procedure.  Ultrasound survey of the left neck was performed with images stored and sent to PACs.  The left neck was prepped with Betadine in a sterile fashion, and a sterile drape was applied covering the operative field. A sterile gown and sterile gloves were used for the procedure. Local anesthesia was provided with 1% Lidocaine.  Infiltration of the skin and subcutaneous tissues was performed with 1% lidocaine for local anesthesia using ultrasound guidance.  An 18 gauge core biopsy gun was then advanced under ultrasound guidance into left-sided enlarged lymph node. Four separate 18 gauge core biopsy were performed.  Images were stored during each needle pass. A sterile bandage was placed.  Patient tolerated the procedure well and remained hemodynamically stable throughout.  No complications were encountered and no significant blood loss was encounter.  COMPLICATIONS: None.  FINDINGS: Multiple left-sided lymph nodes, with the largest biopsied with 4 separate 18 gauge core biopsy.  IMPRESSION: Status post percutaneous biopsy of left-sided lymph node, with tissue specimen sent to pathology for complete histopathologic analysis.  Signed,  Dulcy Fanny. Earleen Newport, DO  [facility]  [facility]   Electronically Signed   By: Corrie Mckusick D.O.   On: 05/07/2014 17:19    Scheduled Meds: . docusate sodium  100 mg Oral BID   Continuous Infusions: .  sodium chloride 100 mL/hr at 05/09/14 9935    Active Problems:   Abnormal CT scan, chest   Chest mass    Time spent: 35 minutes.     [facility] Pager [phone]. If 7PM-7AM, please contact night-coverage at [email or web address], password [facility] 05/09/2014, 10:24 AM  LOS: 4 days

## 2014-05-09 NOTE — Consult Note (Signed)
[name], [name]                                MR#: [id number]  DOB: Mar 01, 1966                       CSN#: [id number]  Referring MD: Dr. Sheliah [facility]  Primary MD: No primary   Reason for Consult: Metastatic Lung Cancer  KKX:FGHW Russell Stanley is a 48 y.o. male non-English speaking from [place] admitted on 10/17 for evaluation of a large left neck mass. He had progressive, 4 month history of left neck swelling, neck pain and intermittent headaches. Per chart review and discussion with the patient via phone interpreter, he denied any fever but had intermittent chills. Denied night sweats, weight loss, productive cough or hemoptysis. He is recent onset of nonproductive cough for a week. He never smoked but chewed tobacco since a teenager. He denies any sick contacts. He denied any chest pain. No abdominal pain or appetite changes. Denies any numbness or tingling. He does have occasional lower back pain. No confusion is reported. He denies any vision changes. He denied any bleeding issues such as hemoptysis, epistaxis, melena or hematochezia. He is not aware of any family history of malignancies.   CT of the neck with contrast on 10/17 revealed a left upper lobe 4.8 x 4.2 x 3.2 cm mass highly concerning for primary malignancy with diffuse miliary pulmonary metastatic lesions bilaterally. Neck adenopathy greater on the left and most notable in the left supraclavicular region and left level 5 region where necrotic adenopathy with extracapsular spread. Small pericardial effusion was seen. Left hilar and subcarinal/pretracheal/AP window adenopathy was noted.  Osseous metastatic disease to the T5 vertebral body was suspected . CT of the head on 10/18  without contrast showed a least 3 intracranial lesions, confirmed by an MRI of the  brain with contrast, largest 13 mm at the left parietal area, with the other 2 being at the right parietal and right basal region.  No other staging CTs or tumor markers are available for review, to rule out further metastatic disease.  He had a  percutaneous biopsy of left-sided lymph node on 10/19, with tissue specimen sent to pathology for complete histopathologic analysis. Report, case number [id number] was consistent with metastatic adenocarcinoma of lung primary. The malignant cells are positive for TTF-1, Napsin-A, and cytokeratin 7. They are negative for cytokeratin 20, progesterone receptor, thyroglobulin, estrogen receptor, and GCDFP.  LDH is elevated at 271.  Of note, Infectious Diseases is involved due to leptomeningeal/brain enhancement concerning for infectious process. Quantiferon is positive therefore he was placed on airborne precautions   We were kindly asked to see the patient in consultation with recommendations.   PMH:  History reviewed. No pertinent past medical history.  Surgeries:  History reviewed. No pertinent past surgical history.  Allergies: No Known Allergies  Medications:  Scheduled Meds:  docusate sodium  100 mg Oral BID   Continuous Infusions:  sodium chloride 100 mL/hr at 05/09/14 0525   PRN Meds:.acetaminophen, acetaminophen, HYDROcodone-acetaminophen, morphine injection, ondansetron (ZOFRAN) IV, ondansetron   ROS: Constitutional: Denies fevers, or abnormal night sweats Eyes: Denies blurriness of vision, double vision or watery eyes Ears, nose, mouth, throat, and face: Denies  mucositis or sore throat Respiratory: Denies dyspnea or wheezes Cardiovascular: Denies palpitation, chest discomfort or lower extremity swelling Gastrointestinal:  Denies nausea, heartburn or change in bowel habits Skin: Denies abnormal skin rashes Lymphatics: Reports new neck swelling as described in HPI, progressive. Denies other lymphadenopathy or easy  bruising Neurological: Denies numbness, tingling or new weaknesses Behavioral/Psych: Mood is stable, no new changes  All other systems were reviewed with the patient and are negative.   Family History:  No family history of cancer  Social History:  reports that he has never smoked. His smokeless tobacco use includes Chew. He reports that he does not drink alcohol or use illicit drugs. Came from [place] 4 years ago. Has never been seen by a primary physician here. Married Lives in [place]   Physical Exam   ECOG PERFORMANCE STATUS: 1 Symptomatic but completely ambulatory (Restricted in physically strenuous activity but ambulatory and able to carry out work of a light or sedentary nature. For example, light housework, office work)    Scientist, forensic Vitals:   05/09/14 0523  BP: 124/94  Pulse: 59  Temp: 97.9 F (36.6 C)  Resp: 16   Filed Weights   05/06/14 0010 05/09/14 0801  Weight: 156 lb 11.2 oz (71.079 kg) 156 lb 11.2 oz (71.079 kg)    GENERAL:alert, no distress and comfortable SKIN: skin color, texture, turgor are normal, no rashes or significant lesions EYES: normal, conjunctiva are pink and non-injected, sclera clear OROPHARYNX:no exudate, no erythema and lips, buccal mucosa, and tongue normal . Poor dentition NECK: remarkable for a palpable firm, large neck mass with nodularities palpated.  LYMPH: palpable left cervical lymph node, firm, non tender, about 4 cm. Left supraclavicular node easily palpated. No axillary or inguinal adenopathy appreciated LUNGS: clear to auscultation and percussion with normal breathing effort HEART: regular rate & rhythm and no murmurs and no lower extremity edema ABDOMEN:abdomen soft, non-tender and normal bowel sounds Musculoskeletal:no cyanosis of digits and no clubbing  PSYCH: alert & oriented x 3 with fluent speech NEURO: no focal motor/sensory deficits   labs:  HIV non reactive.  Hepatitis panel negative Quantiferon Positive  CBC  Recent  Labs Lab 05/05/14 1722 05/06/14 0358  WBC 6.2 6.4  HGB 14.5 14.5  HCT 41.4 41.6  PLT 198 198  MCV 83.5 83.7  MCH 29.2 29.2  MCHC 35.0 34.9  RDW 12.2 12.2  LYMPHSABS 1.4  --   MONOABS 0.5  --   EOSABS 0.2  --   BASOSABS 0.0  --     Anemia panel:  No results found for this basename: VITAMINB12, FOLATE, FERRITIN, TIBC, IRON, RETICCTPCT,  in the last 72 hours  CMP    Recent Labs Lab 05/05/14 1722 05/06/14 0358 05/08/14 0525  NA 138 136* 135*  K 4.0 3.7 4.0  CL 103 101 101  CO2 24 22 19   GLUCOSE 108* 176* 89  BUN 15 14 13   CREATININE 1.29 0.99 0.84  CALCIUM 9.3 9.4 9.2  MG  --  2.1  --   AST  --  45*  --   ALT  --  45  --   ALKPHOS  --  407*  --   BILITOT  --  0.4  --         Component Value Date/Time   BILITOT 0.4 05/06/2014 0358      Recent Labs Lab 05/06/14 1023  INR 1.09    No results found for this basename: DDIMER,  in the last 72 hours  Imaging Studies: I have reviewed all the imaging study myself  Ct Head Wo Contrast  05/06/2014    COMPARISON:  Neck CT same date.  FINDINGS: There are at least 3 hyperdense lesions within the brain located within the medial aspect of the posterior frontal lobes and right parietal lobe. Left posterior medial frontal lobe lesion with minimal surrounding vasogenic edema. No mass effect. Given the CT findings, intracranial findings may represent intracranial metastatic disease. Spread of infection not entirely excluded given the miliary appearance of the visualized lungs. Follow-up brain MR with contrast would be helpful for further delineation.  No intracranial hemorrhage separate from the above described findings.  No hydrocephalus.  No CT evidence of large acute infarct.  Remote medial wall fracture right orbit.  IMPRESSION: At least 3 intracranial lesions possibly representing metastatic disease with infection not entirely excluded given the miliary appearance of the visualized upper lung. MR with contrast may be  considered for further delineation.   Electronically Signed   By: Chauncey Cruel M.D.   On: 05/06/2014 00:06   Ct Soft Tissue Neck W Contrast  05/05/2014   ADDENDUM REPORT: 05/05/2014 20:33  ADDENDUM: Results were discussed with Dr. Roel Cluck, the possibility of infection such as miliary TB with superimposed active TB is a consideration. Aspiration of the necrotic left level 5 lymph node with culture and cytology may prove helpful for further delineation. Additionally, formal chest CT may be considered.   Electronically Signed   By: Chauncey Cruel M.D.   On: 05/05/2014 20:33   05/05/2014   COMPARISON:  None.  FINDINGS: Left upper lobe 0 4.8 x 4.2 x 3.2 cm mass highly concerning for primary malignancy with diffuse miliary pulmonary metastatic lesions bilaterally. Additionally, neck adenopathy greater on the left and most notable in the left supraclavicular region and left level 5 region where necrotic adenopathy with extracapsular spread is noted.  Small pericardial effusion. Left hilar and subcarinal/pretracheal/AP window adenopathy.  Osseous metastatic disease to the T5 vertebral body suspected.  No primary neck mass is noted. Direct visualization would be necessary to exclude subtle mucosal abnormality.  Limited imaging of intracranial structures without evidence of metastatic disease.  IMPRESSION: Left upper lobe 0 4.8 x 4.2 x 3.2 cm mass highly concerning for primary malignancy with diffuse miliary pulmonary metastatic lesions bilaterally.  Neck adenopathy greater on the left and most notable in the left supraclavicular region and left level 5 region where necrotic adenopathy with extracapsular spread is noted.  Small pericardial effusion. Left hilar and subcarinal/pretracheal/AP window adenopathy.  Osseous metastatic disease to the T5 vertebral body suspected.  No primary neck mass is noted. Direct visualization would be necessary to exclude subtle mucosal abnormality.  These results were called by telephone at  the time of interpretation on 05/05/2014 at 7:04pm to [facility] PA, who verbally acknowledged these results.  Electronically Signed: By: Chauncey Cruel M.D. On: 05/05/2014 19:10   Mr Jeri Cos XT Contrast  05/06/2014   COMPARISON:  Head CT 05/05/2014 there is no evidence of old or acute infarction.  The brainstem and cerebellum are normal. There is a 13 x 12 mm mass lesion in the left parietal gray-white junction region with mild surrounding edema. This is slightly hemorrhagic. This is most consistent with a metastasis. There is a 7 mm in diameter enhancing lesion at the medial right parietal vertex. Very minimal petechial hemorrhage within this lesion. There is a 4 mm enhancing lesion in the right basal ganglia without hemorrhage or edema. There  is leptomeningeal enhancement in the right parieto-occipital junction region without visible underlying brain abnormality.  Ventricular size is normal. No appendicolith enhancement. No extra-axial fluid collection. No pituitary mass. No inflammatory sinus disease. There are marrow space lesions in the clivus consistent with metastatic disease.  FINDINGS: Three enhancing brain masses consistent with metastatic disease. 12 x 13 mm left parietal lesion. 7 mm right parietal lesion. 4 mm right basal ganglia lesion. Leptomeninges L enhancement along the surface at the right parieto-occipital junction worrisome for leptomeningeal carcinomatosis. Marrow space lesions of the clivus consistent with metastatic disease to that region.  Clinically, there is also some concern regarding the possibility of disseminated tuberculosis or fungal infection. That possibility does exist but seems considerably less likely given this pattern of disease.   Electronically Signed   By: Nelson Chimes M.D.   On: 05/06/2014 17:32   Patient: Russell Stanley, Russell Stanley Collected: 05/06/2014 Client: [place] Accession: [id number] Received: 05/07/2014 Russell Stanley DOB: 1966-03-27 Age: 65 Gender: M  Reported: 05/09/2014 [address] Patient Ph: [phone] MRN #: [id number] [place], [place] [address] Visit #: [id number] Chart #: Phone:  Fax: CC: EPORT OF SURGICAL PATHOLOGY FINA DIAGNOSIS Diagnosis Lymph node, needle/core biopsy, Neck - METASTATIC ADENOCARCINOMA, CONSISTENT WITH LUNG PRIMARY. - SEE COMMENT. Microscopic Comment The malignant cells are positive for TTF-1, Napsin-A, and cytokeratin 7. They are negative for cytokeratin 20,progesterone receptor, thyroglobulin, estrogen receptor, and GCDFP. Overall, the findings are consistent with metastatic adenocarcinoma, consistent with lung primary. (JBK:gt, 05/10/15 Enid Cutter MD Pathologist, Electronic Signature   A/P: 48 y.o. male with  New diagnosis of metastatic adenocarcinoma of lung primary with bilateral lung metastases, lymphadenopathy metastasis and metastatic brain lesions with leptomeningeal enhancement He had a percutaneous biopsy of left-sided lymph node of the neck on 10/19, with tissue specimen consistent with metastatic adenocarcinoma of lung primary.  I will get the pathology Department to send his tissue to [facility] for additional mutation study I recommend Radiation Oncology consultation while in hospital. I discussed with the patient the diagnosis. I explained to him why transferring to [facility] is important, to get his treatment initiated soon as possible. I have consulted with infectious disease service who felt that his overall presentation is not consistent with extrapulmonary TB.  The patient can be removed from isolation and no treatment is recommended from infectious disease standpoint & there is no contraindication to proceed with high-dose steroids at this point in time.  Full Code  Elease Hashimoto 05/09/2014 12:33 PM   [place], Orlan Aversa, MD 05/09/2014

## 2014-05-09 NOTE — Progress Notes (Signed)
Patient no longer needs airborne precautions. Based upon tissue biopsy, clinica presentation is due to metastatic adenocarcinoma. If questions, please call infectious diseases  Caren Griffins B. [place] [facility] [phone]

## 2014-05-09 NOTE — Progress Notes (Addendum)
[place] [place] for Infectious Disease    Date of Admission:  05/05/2014   Total days of antibiotics 0   ID: Russell Stanley is a 48 y.o. male originally from [place] found to have lung and brain/leptomengeal enhancement concerning for infectious process such as mtb or fungal infection vs. malignancy  Active Problems:   Abnormal CT scan, chest   Chest mass    Subjective: Afebrile  Medications:  . docusate sodium  100 mg Oral BID    Objective: Vital signs in last 24 hours: Temp:  [97.9 F (36.6 C)-98.3 F (36.8 C)] 97.9 F (36.6 C) (10/21 0523) Pulse Rate:  [59-72] 59 (10/21 0523) Resp:  [16] 16 (10/21 0523) BP: (112-124)/(72-94) 124/94 mmHg (10/21 0523) SpO2:  [98 %-100 %] 100 % (10/21 0523) Weight:  [156 lb 11.2 oz (71.079 kg)] 156 lb 11.2 oz (71.079 kg) (10/21 0801)  did not examine   Lab Results  Recent Labs  05/08/14 0525  NA 135*  K 4.0  CL 101  CO2 19  BUN 13  CREATININE 0.84    Microbiology: Hiv/hep b/hep c negative quantiferon positive Path = prelim c/w metastatic adeno ca.  Studies/Results: US Biopsy  05/07/2014   CLINICAL DATA:  48 year old male, immigrant from [place]. Recently discovered to have lymphadenopathy of the neck as well as lung mass. He has been referred for percutaneous biopsy.  EXAM: ULTRASOUND GUIDED CORE BIOPSY OF NECK MASS  MEDICATIONS: 1.0 mg IV Versed; 50 mcg IV Fentanyl  Total Moderate Sedation Time: 10  PROCEDURE: The procedure, risks, benefits, and alternatives were explained to the patient. Questions regarding the procedure were encouraged and answered. The patient understands and consents to the procedure.  Ultrasound survey of the left neck was performed with images stored and sent to PACs.  The left neck was prepped with Betadine in a sterile fashion, and a sterile drape was applied covering the operative field. A sterile gown and sterile gloves were used for the procedure. Local anesthesia was provided with 1% Lidocaine.   Infiltration of the skin and subcutaneous tissues was performed with 1% lidocaine for local anesthesia using ultrasound guidance.  An 18 gauge core biopsy gun was then advanced under ultrasound guidance into left-sided enlarged lymph node. Four separate 18 gauge core biopsy were performed.  Images were stored during each needle pass. A sterile bandage was placed.  Patient tolerated the procedure well and remained hemodynamically stable throughout.  No complications were encountered and no significant blood loss was encounter.  COMPLICATIONS: None.  FINDINGS: Multiple left-sided lymph nodes, with the largest biopsied with 4 separate 18 gauge core biopsy.  IMPRESSION: Status post percutaneous biopsy of left-sided lymph node, with tissue specimen sent to pathology for complete histopathologic analysis.  Signed,  Dulcy Fanny. Earleen Newport, DO  [facility]  [facility]   Electronically Signed   By: Corrie Mckusick D.O.   On: 05/07/2014 17:19     Assessment/Plan: Patient with high suspicion for pulmonary and extrapulmonary TB, however he underwent Left cervical LAN which found to be consistent with metastatic adenoca, with likely lung as the primary. Specimen was also sent for  AFB, fungal, bacterial which was negative. 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 Given that his life expectancy is 1-70yr. For now will  defer LTBI treatment until after he undergoes treatment for cancer. Case discussed with medical director at [facility] [employer].  Baxter Flattery [facility] Infectious Diseases Cell: [phone] Pager: [phone]  05/09/2014, 10:43 AM

## 2014-05-10 ENCOUNTER — Inpatient Hospital Stay
Admit: 2014-05-10 | Discharge: 2014-05-10 | Disposition: A | Discharge status: Home / Self Care | Payer: Self-pay | Attending: Radiation Oncology | Admitting: Radiation Oncology

## 2014-05-10 ENCOUNTER — Ambulatory Visit
Admit: 2014-05-10 | Discharge: 2014-05-10 | Disposition: A | Discharge status: Home / Self Care | Payer: No Typology Code available for payment source | Attending: Radiation Oncology | Admitting: Radiation Oncology

## 2014-05-10 DIAGNOSIS — C7931 Secondary malignant neoplasm of brain: Secondary | ICD-10-CM

## 2014-05-10 NOTE — Progress Notes (Signed)
[facility] PROGRESS NOTE  Russell Stanley EHM:[id number] DOB: 07-02-1966 DOA: 05/05/2014 PCP: No primary provider on file.  Summary  Russell Stanley is a 48 y.o. male has no past medical history on file. Presented with neck swelling for the past 4 months. Patient Has immigrated from [place] 4 years ago. He does not speak english, history obtain through the interpreter. Patient was evaluated in ER and was found to have firm left neck lymphadenopathy. CT of the neck was ordered showing large necrosing neck lymphadenopathy, Left upper lobe 0 4.8 x 4.2 x 3.2 cm mass highly concerning for primary malignancy with diffuse miliary pulmonary metastatic lesions bilaterally. Ct head ; At least 3 intracranial lesions possibly representing metastatic disease with infection not entirely excluded. No history of fever, weight loss but has occasional chills.Occasional cough non-productive.  Eventually workup which included left neck lymph node biopsy was suggestive of adenocarcinoma with likely source lung is the primary with metastasis.    Assessment/Plan:  1-Neck Lymphadenopathy, left upper lobe Mass, 3 intracranial lesions - due to adenocarcinoma with most likely primary lung with multiple metastasis.  - ID and oncology both were involved, initially there was suspicion that patient could have tuberculosis, his quanteferon is positive suggesting at least latent tuberculosis. He underwent left neck lymph node biopsy by interventional radiology pathology is suggestive of of adenocarcinoma with likely source lung is the primary with metastasis, at this time consensus as patient does not need any antitubercular treatment, no airway precaution, his AFB smears so far have been negative in the lymph node and a 1 sputum.  Case was discussed with oncologist Dr. Alvy Bimler which and ID physician Dr. Graylon Good, he will now get IV Decadron for brain metastases with evidence of some edema, transfer to [facility] long hospital  for radiation treatments. Oncology will arrange for it. Prognosis long term is very poor. Patient and family still not at comes with a prognosis and diagnosis.   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      language/  Loistine Simas  Code Status: Full Code.  Family Communication: Care discussed with patient.  Disposition Plan: Transfer to [place] long.    Consultants:  ID, IR on oncology  Procedures:  US guide biopsy neck mass.   Antibiotics:  none  HPI/Subjective: In bed eating breakfast, denies any headache, no chest pain shortness of breath. No abdominal pain or focal weakness.  Objective: Filed Vitals:   05/10/14 0606  BP: 113/76  Pulse: 86  Temp: 98.4 F (36.9 C)  Resp: 16    Intake/Output Summary (Last 24 hours) at 05/10/14 0854 Last data filed at 05/10/14 0606  Gross per 24 hour  Intake   1600 ml  Output   3025 ml  Net  -1425 ml   Filed Weights   05/06/14 0010 05/09/14 0801  Weight: 71.079 kg (156 lb 11.2 oz) 71.079 kg (156 lb 11.2 oz)    Exam:   General:  Alert, in no distress.   Cardiovascular: S 1, S 2 RRR  Respiratory: CTA  Abdomen: Bs present, soft, NT  Musculoskeletal: no edema.    Left neck lymph node biopsy site stable  Data Reviewed: Basic Metabolic Panel:  Recent Labs Lab 05/05/14 1722 05/06/14 0358 05/08/14 0525  NA 138 136* 135*  K 4.0 3.7 4.0  CL 103 101 101  CO2 24 22 19   GLUCOSE 108* 176* 89  BUN 15 14 13   CREATININE 1.29 0.99 0.84  CALCIUM 9.3 9.4 9.2  MG  --  2.1  --   PHOS  --  3.3  --    Liver Function Tests:  Recent Labs Lab 05/06/14 0358  AST 45*  ALT 45  ALKPHOS 407*  BILITOT 0.4  PROT 7.7  ALBUMIN 3.6   No results found for this basename: LIPASE, AMYLASE,  in the last 168 hours No results found for this basename: AMMONIA,  in the last 168 hours CBC:  Recent Labs Lab 05/05/14 1722 05/06/14 0358  WBC 6.2 6.4  NEUTROABS 4.2  --   HGB 14.5 14.5  HCT 41.4 41.6  MCV 83.5 83.7  PLT 198 198    Cardiac Enzymes: No results found for this basename: CKTOTAL, CKMB, CKMBINDEX, TROPONINI,  in the last 168 hours BNP (last 3 results) No results found for this basename: PROBNP,  in the last 8760 hours CBG: No results found for this basename: GLUCAP,  in the last 168 hours  Recent Results (from the past 240 hour(s))  RAPID STREP SCREEN     Status: None   Collection Time    05/05/14  3:10 PM      Result Value Ref Range Status   Streptococcus, Group A Screen (Direct) NEGATIVE  NEGATIVE Final   Comment: (NOTE)     A Rapid Antigen test may result negative if the antigen level in the     sample is below the detection level of this test. The FDA has not     cleared this test as a stand-alone test therefore the rapid antigen     negative result has reflexed to a Group A Strep culture.  CULTURE, GROUP A STREP     Status: None   Collection Time    05/05/14  3:10 PM      Result Value Ref Range Status   Specimen Description THROAT   Final   Special Requests NONE   Final   Culture     Final   Value: No Beta Hemolytic Streptococci Isolated     Performed at [facility]   Report Status 05/07/2014 FINAL   Final  AFB CULTURE WITH SMEAR     Status: None   Collection Time    05/07/14  4:37 PM      Result Value Ref Range Status   Specimen Description TISSUE LYMPH NODE   Final   Special Requests Immunocompromised   Final   Acid Fast Smear     Final   Value: NO ACID FAST BACILLI SEEN     Performed at [facility]   Culture     Final   Value: CULTURE WILL BE EXAMINED FOR 6 WEEKS BEFORE ISSUING A FINAL REPORT     Performed at [facility]   Report Status PENDING   Incomplete  FUNGUS CULTURE W SMEAR     Status: None   Collection Time    05/07/14  4:37 PM      Result Value Ref Range Status   Specimen Description TISSUE LYMPH NODE   Final   Special Requests Immunocompromised   Final   Fungal Smear     Final   Value: NO YEAST OR FUNGAL ELEMENTS SEEN     Performed at  [facility]   Culture     Final   Value: CULTURE IN PROGRESS FOR FOUR WEEKS     Performed at [facility]   Report Status PENDING   Incomplete  TISSUE CULTURE     Status: None   Collection Time  05/07/14  4:37 PM      Result Value Ref Range Status   Specimen Description TISSUE LYMPH NODE   Final   Special Requests NONE   Final   Gram Stain     Final   Value: NO WBC SEEN     NO ORGANISMS SEEN     Performed at [facility]   Culture     Final   Value: NO GROWTH 3 DAYS     Performed at [facility]   Report Status PENDING   Incomplete     Studies: Ct Chest Wo Contrast  05/09/2014   CLINICAL DATA:  Evaluate chest mass  EXAM: CT CHEST WITHOUT CONTRAST  TECHNIQUE: Multidetector CT imaging of the chest was performed following the standard protocol without IV contrast.  COMPARISON:  None  FINDINGS: Mediastinum: The heart size appears normal. No pericardial effusion. The trachea appears patent and is midline. The esophagus appears normal. Sub- carinal lymph node measures 9 mm. There is a right paratracheal lymph node measuring 9 mm. Multiple, prominent left axillary lymph nodes measure up to 8 mm.  Lungs/Pleura: There is a mass within the left upper lobe measuring 2.4 x 2.3 by 4.4 cm. This mass extends into the left hilum. There is involvement and narrowing of the left upper lobe bronchi. Left upper lobe interlobular septal thickening is identified compatible with lymphangitic spread of tumor. Innumerable small nodules are identified throughout both lungs compatible with widespread metastatic disease. A very small left pleural effusion is noted.  Upper Abdomen: Incidental imaging through the upper abdomen shows no acute findings. The adrenal glands appear normal. There is no mass.  Musculoskeletal: Evidence of multi focal lytic and sclerotic bone metastases are identified. Multiple lesions are seen throughout the thoracic spine. There is a lytic lesion involving the  T10 vertebra with associated pathologic fracture through the superior endplate. T5 lytic lesion measures 0.9 cm.  IMPRESSION: 1. Left upper lobe lung mass is worrisome for primary pulmonary neoplasm. 2. Evidence of lymphangitic spread of tumor within the left upper lobe and diffuse, bilateral pulmonary metastasis. 3. Multi focal bone metastasis with pathologic fracture involving the superior endplate of G66.   Electronically Signed   By: Kerby Moors M.D.   On: 05/09/2014 13:34    Scheduled Meds: . dexamethasone  4 mg Intravenous 4 times per day  . docusate sodium  100 mg Oral BID  . pantoprazole  40 mg Oral Daily   Continuous Infusions: . sodium chloride 100 mL/hr at 05/10/14 0020    Active Problems:   Abnormal CT scan, chest   Chest mass    Time spent: 35 minutes.     [facility] Pager [phone]. If 7PM-7AM, please contact night-coverage at [email or web address], password [email or web address] 05/10/2014, 8:54 AM  LOS: 5 days

## 2014-05-10 NOTE — Discharge Planning (Addendum)
[facility] dispatch called for transfer to [place] [facility]. Report has been called to unit. Transfer to [facility] at 1415.

## 2014-05-10 NOTE — Consult Note (Signed)
[facility] NEW PATIENT EVALUATION  Name: Russell Stanley MRN: [id number]  Date:   05/05/2014           DOB: 12/07/1965  Status: inpatient   CC: Dr. Heath Lark   REFERRING PHYSICIAN: Dr. 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 male who is seen today through the courtesy of Dr. Alvy Bimler for consideration of whole brain radiotherapy in the management of his metastatic adenocarcinoma of the lung to bone. He presented with a four-month history of left neck swelling and pain along with intermittent headaches. His history is obtained through his medical chart and conversation with the patient through an interpreter. A CT scan of the neck on October 17 showed a 4.8 x 4.2 x 3.2 cm mass within left upper lobe along with neck adenopathy, most notable along the left supraclavicular region. A CT scan of the head was obtained on October 17 which showed at least 3 intracranial lesions perhaps representing metastatic disease. Followup brain MRI on October October 18 showed leptomeningeal enhancement in the right parieto-occipital junction along with 3 enhancing brain lesions consistent with metastatic disease. The largest of which measured 1.2 x 1.3 cm in the left parietal lesion. A 7 mm lesion was seen along the right parietal region and a 4 mm lesion was seen along the right basal ganglia. CT of the chest 05/09/2014 showed a left upper lobe mass worrisome for a primary pulmonary neoplasm along with evidence of lymphangitic spread of tumor within the left upper lobe along with bilateral pulmonary metastases. Multifocal bone metastases were seen with a pathologic fracture involving the superior endplate of N82. The patient does give a history of intermittent mid back pain. He describes this pain the left neck/supraclavicular region is 2/10. On 05/06/2014 he underwent a left neck he biopsy which was  diagnostic for adenocarcinoma, consistent with a lung primary. He was seen by Dr. Alvy Bimler of medical oncology and is seen today for consideration of urgent radiation therapy to the whole brain.  PREVIOUS RADIATION THERAPY: No   PAST MEDICAL HISTORY:  has no past medical history on file.     PAST SURGICAL HISTORY: History reviewed. No pertinent past surgical history.   FAMILY HISTORY: family history is not on file.   SOCIAL HISTORY:  reports that he has never smoked. His smokeless tobacco use includes Chew. He reports that he does not drink alcohol or use illicit drugs.   ALLERGIES: Review of patient's allergies indicates no known allergies.   MEDICATIONS:  Current Facility-Administered Medications  Medication Dose Route Frequency Provider Last Rate Last Dose  . 0.9 %  sodium chloride infusion   Intravenous Continuous Belkys A Regalado, MD 100 mL/hr at 05/10/14 1018    . acetaminophen (TYLENOL) tablet 650 mg  650 mg Oral Q6H PRN Toy Baker, MD       Or  . acetaminophen (TYLENOL) suppository 650 mg  650 mg Rectal Q6H PRN Toy Baker, MD      . dexamethasone (DECADRON) injection 4 mg  4 mg Intravenous 4 times per day Heath Lark, MD   4 mg at 05/10/14 1244  . docusate sodium (COLACE) capsule 100 mg  100 mg Oral BID Toy Baker, MD   100 mg at 05/10/14 1018  . HYDROcodone-acetaminophen (NORCO/VICODIN) 5-325 MG per tablet 1-2 tablet  1-2 tablet Oral Q4H PRN Toy Baker, MD   2 tablet at  05/08/14 2010  . morphine 2 MG/ML injection 2 mg  2 mg Intravenous Q3H PRN Toy Baker, MD   2 mg at 05/08/14 2122  . ondansetron (ZOFRAN) tablet 4 mg  4 mg Oral Q6H PRN Toy Baker, MD       Or  . ondansetron (ZOFRAN) injection 4 mg  4 mg Intravenous Q6H PRN Toy Baker, MD      . pantoprazole (PROTONIX) EC tablet 40 mg  40 mg Oral Daily Ni Gorsuch, MD   40 mg at 05/10/14 1018     REVIEW OF SYSTEMS:  Pertinent items are noted in HPI.    PHYSICAL  EXAM:  height is 5\' 6"  (1.676 m) and weight is 156 lb 11.2 oz (71.079 kg). His oral temperature is 97.6 F (36.4 C). His blood pressure is 110/81 and his pulse is 89. His respiration is 16 and oxygen saturation is 99%.   Alert and oriented Burmese who speaks Loistine Simas through an interpreter. Head and neck examination: Grossly unremarkable. Nodes: There is a firm nodular left neck mass with involvement of the lower cervical and supraclavicular region. Chest: Lungs clear. Back: There is no palpable spinal discomfort. Neurologic examination: Grossly nonfocal.   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 He has leptomeningeal spread which is a poor prognostic factor. I feel he would benefit from a two-week course of whole brain radiotherapy. I am also concerned he may be mildly symptomatic from his T10 compression fracture. Will go ahead and treat this at the same time as his whole brain radiation therapy. I discussed the potential acute and  late toxicities of radiation therapy to his whole brain and spine through an interpreter. I explained to the patient that his life expectancy is probably no more than 4-6 months on average. I explained to him that he will not be able to return to work at a [employer], [employer]. Consent is signed through his interpreter. I anticipate delivering 2 weeks of palliative radiation therapy to his whole brain and T10 vertebra. His treatment should be reasonably well tolerated. He will undergo simulation/treatment planning  today and begin his treatment tomorrow. Dr. Alvy Bimler will discuss with him the role of chemotherapy following completion of radiation therapy. His tissue has been sent to [facility] for additional mutation study.    PLAN: As discussed above.  I spent 50 minutes  face to face with the patient and more than 50% of that time was spent in counseling and/or coordination of care.

## 2014-05-10 NOTE — Progress Notes (Signed)
Complex simulation/treatment planning note: The patient was taken to the CT simulator. A head cast was constructed for immobilization. His head was scanned. The CT data set was sent to the planning system for contouring of his normal structures. He was set up to right and left lateral fields and 2 unique multileaf collimators were designed to conform the field. His chest was then scanned and the CT data set was sent to the planning system where I contoured his T10 vertebra. He was set up to AP and PA fields and 2 separate MLCs were designed to conform the field. I am prescribing 3000 cGy in 10 sessions to his brain and 2500 cGy in 10 sessions to his T10 vertebra (T9-T11).

## 2014-05-11 ENCOUNTER — Encounter (HOSPITAL_COMMUNITY): Payer: Self-pay | Admitting: Internal Medicine

## 2014-05-11 ENCOUNTER — Inpatient Hospital Stay
Admit: 2014-05-11 | Discharge: 2014-05-11 | Disposition: A | Discharge status: Home / Self Care | Payer: Self-pay | Attending: Radiation Oncology | Admitting: Radiation Oncology

## 2014-05-11 ENCOUNTER — Telehealth: Payer: Self-pay | Admitting: *Deleted

## 2014-05-11 ENCOUNTER — Telehealth: Payer: Self-pay | Admitting: Hematology and Oncology

## 2014-05-11 DIAGNOSIS — C7800 Secondary malignant neoplasm of unspecified lung: Secondary | ICD-10-CM

## 2014-05-11 DIAGNOSIS — C799 Secondary malignant neoplasm of unspecified site: Secondary | ICD-10-CM

## 2014-05-11 DIAGNOSIS — C801 Malignant (primary) neoplasm, unspecified: Secondary | ICD-10-CM

## 2014-05-11 DIAGNOSIS — C770 Secondary and unspecified malignant neoplasm of lymph nodes of head, face and neck: Principal | ICD-10-CM

## 2014-05-11 DIAGNOSIS — C7931 Secondary malignant neoplasm of brain: Secondary | ICD-10-CM

## 2014-05-11 DIAGNOSIS — R221 Localized swelling, mass and lump, neck: Secondary | ICD-10-CM

## 2014-05-11 DIAGNOSIS — Z201 Contact with and (suspected) exposure to tuberculosis: Secondary | ICD-10-CM

## 2014-05-11 DIAGNOSIS — C3490 Malignant neoplasm of unspecified part of unspecified bronchus or lung: Secondary | ICD-10-CM

## 2014-05-11 HISTORY — DX: Contact with and (suspected) exposure to tuberculosis: Z20.1

## 2014-05-11 HISTORY — DX: Malignant neoplasm of unspecified part of unspecified bronchus or lung: C34.90

## 2014-05-11 LAB — TISSUE CULTURE
CULTURE: NO GROWTH
GRAM STAIN: NONE SEEN

## 2014-05-11 MED ORDER — ENOXAPARIN SODIUM 40 MG/0.4ML ~~LOC~~ SOLN
40.0000 mg | SUBCUTANEOUS | Status: DC
  Administered 2014-05-11: 40 mg via SUBCUTANEOUS
  Filled 2014-05-11 (×2): qty 0.4

## 2014-05-11 MED ORDER — INFLUENZA VAC SPLIT QUAD 0.5 ML IM SUSY
0.5000 mL | PREFILLED_SYRINGE | INTRAMUSCULAR | Status: AC
  Administered 2014-05-12: 0.5 mL via INTRAMUSCULAR
  Filled 2014-05-11 (×2): qty 0.5

## 2014-05-11 NOTE — Progress Notes (Signed)
Progress Note   Russell Stanley SAY:[id number] DOB: July 17, 1966 DOA: 05/05/2014 PCP: No primary provider on file.   Brief Narrative:   Russell Stanley is an 48 y.o. male with a PMH of latent TB was admitted 05/05/14 with neck swelling/masses status post left neck lymph node biopsy suggesting adenocarcinoma with primary source felt to be the lung, with metastasis.  Assessment/Plan:   Principal Problem:   Primary lung cancer with metastasis from lung to other site  Patient has evidence of T10 and brain metastasis, and has begun radiation treatments to these areas.  He has been seen by Dr. Alvy Bimler with plans for outpatient palliative chemotherapy.  Despite the use of an interpreter to explain his treatment plan, I'm not entirely convinced that he understands the full scope and prognosis of his current disease status.  Active Problems:   Exposure to TB  The patient is from [place], and has a + quantiferon test, indicative of prior exposure.  Left cervical biopsy negative for AFB.  Defer treatment for latent TB (which is given to reduce the lifetime risk of re-activation).  See Dr. Storm Frisk (ID) notes for details.    Brain metastases  Continue radiation treatment and Decadron. On GI prophylaxis.    DVT Prophylaxis  Lovenox ordered.  Code Status: Full. Family Communication: Family updated with help of interpreter. Disposition Plan: Home when stable.   IV Access:    Peripheral IV   Procedures and diagnostic studies:   Ct Chest Wo Contrast 05/09/2014: 1. Left upper lobe lung mass is worrisome for primary pulmonary neoplasm. 2. Evidence of lymphangitic spread of tumor within the left upper lobe and diffuse, bilateral pulmonary metastasis. 3. Multi focal bone metastasis with pathologic fracture involving the superior endplate of A35.     US Biopsy 05/07/2014: Status post percutaneous biopsy of left-sided lymph node, with tissue specimen sent to pathology for complete histopathologic  analysis.    Medical Consultants:    Dr. Arloa Koh, Radiation Oncology  Dr. Carlyle Basques, ID  Anti-Infectives:    None.  Subjective:   Rosey Bath complains of some pain at the site of his left cervical lymph node biopsy site and also some upper chest pain. Otherwise, no complaints of nausea, vomiting, shortness of breath, or cough.  Objective:    Filed Vitals:   05/10/14 1500 05/10/14 2140 05/11/14 0532 05/11/14 1447  BP: 110/81 117/79 121/79 125/81  Pulse: 89 97 74 76  Temp: 97.6 F (36.4 C) 97.9 F (36.6 C) 97.6 F (36.4 C) 97.7 F (36.5 C)  TempSrc: Oral Oral Oral Oral  Resp: 16 18 16 18   Height:      Weight:      SpO2: 99% 99% 97% 99%    Intake/Output Summary (Last 24 hours) at 05/11/14 1539 Last data filed at 05/11/14 0851  Gross per 24 hour  Intake    480 ml  Output      0 ml  Net    480 ml    Exam: Gen:  NAD Cardiovascular:  RRR, No M/R/G Respiratory:  Lungs CTAB Gastrointestinal:  Abdomen soft, NT/ND, + BS Extremities:  No C/E/C   Data Reviewed:    Labs: Basic Metabolic Panel:  Recent Labs Lab 05/05/14 1722 05/06/14 0358 05/08/14 0525  NA 138 136* 135*  K 4.0 3.7 4.0  CL 103 101 101  CO2 24 22 19   GLUCOSE 108* 176* 89  BUN 15 14 13   CREATININE 1.29 0.99 0.84  CALCIUM 9.3 9.4 9.2  MG  --  2.1  --   PHOS  --  3.3  --    GFR Estimated Creatinine Clearance: 94.9 ml/min (by C-G formula based on Cr of 0.84). Liver Function Tests:  Recent Labs Lab 05/06/14 0358  AST 45*  ALT 45  ALKPHOS 407*  BILITOT 0.4  PROT 7.7  ALBUMIN 3.6   Coagulation profile  Recent Labs Lab 05/06/14 1023  INR 1.09    CBC:  Recent Labs Lab 05/05/14 1722 05/06/14 0358  WBC 6.2 6.4  NEUTROABS 4.2  --   HGB 14.5 14.5  HCT 41.4 41.6  MCV 83.5 83.7  PLT 198 198    Microbiology Recent Results (from the past 240 hour(s))  RAPID STREP SCREEN     Status: None   Collection Time    05/05/14  3:10 PM      Result Value Ref Range Status     Streptococcus, Group A Screen (Direct) NEGATIVE  NEGATIVE Final   Comment: (NOTE)     A Rapid Antigen test may result negative if the antigen level in the     sample is below the detection level of this test. The FDA has not     cleared this test as a stand-alone test therefore the rapid antigen     negative result has reflexed to a Group A Strep culture.  CULTURE, GROUP A STREP     Status: None   Collection Time    05/05/14  3:10 PM      Result Value Ref Range Status   Specimen Description THROAT   Final   Special Requests NONE   Final   Culture     Final   Value: No Beta Hemolytic Streptococci Isolated     Performed at [facility]   Report Status 05/07/2014 FINAL   Final  AFB CULTURE WITH SMEAR     Status: None   Collection Time    05/07/14  4:37 PM      Result Value Ref Range Status   Specimen Description TISSUE LYMPH NODE   Final   Special Requests Immunocompromised   Final   Acid Fast Smear     Final   Value: NO ACID FAST BACILLI SEEN     Performed at [facility]   Culture     Final   Value: CULTURE WILL BE EXAMINED FOR 6 WEEKS BEFORE ISSUING A FINAL REPORT     Performed at [facility]   Report Status PENDING   Incomplete  FUNGUS CULTURE W SMEAR     Status: None   Collection Time    05/07/14  4:37 PM      Result Value Ref Range Status   Specimen Description TISSUE LYMPH NODE   Final   Special Requests Immunocompromised   Final   Fungal Smear     Final   Value: NO YEAST OR FUNGAL ELEMENTS SEEN     Performed at [facility]   Culture     Final   Value: CULTURE IN PROGRESS FOR FOUR WEEKS     Performed at [facility]   Report Status PENDING   Incomplete  TISSUE CULTURE     Status: None   Collection Time    05/07/14  4:37 PM      Result Value Ref Range Status   Specimen Description TISSUE LYMPH NODE   Final   Special Requests NONE   Final   Gram Stain     Final  Value: NO WBC SEEN     NO ORGANISMS SEEN     Performed at  [facility]   Culture     Final   Value: NO GROWTH 3 DAYS     Performed at [facility]   Report Status 05/11/2014 FINAL   Final     Medications:   . dexamethasone  4 mg Intravenous 4 times per day  . docusate sodium  100 mg Oral BID  . [START ON 05/12/2014] Influenza vac split quadrivalent PF  0.5 mL Intramuscular Tomorrow-1000  . pantoprazole  40 mg Oral Daily   Continuous Infusions: . sodium chloride 100 mL/hr at 05/11/14 1106    Time spent: 35 minutes with > 50% of time discussing current diagnostic test results, clinical impression and plan of care.    LOS: 6 days   [facility] Pager [phone]. If unable to reach me by pager, please call my cell phone at [phone].  *Please refer to [email or web address], password [email or web address] to get updated schedule on who will round on this patient, as hospitalists switch teams weekly. If 7PM-7AM, please contact night-coverage at [email or web address], password [email or web address] for any overnight needs.  05/11/2014, 3:39 PM

## 2014-05-11 NOTE — Progress Notes (Signed)
[facility] Therapy Treatment Record Phone [phone]   Radiation Therapy was administered to Russell Stanley on: 05/11/2014  2:40 PM and was treatment # 1 out of a planned course of 10 treatments.

## 2014-05-11 NOTE — Progress Notes (Signed)
Spiritual care responded to referral from nursing for pt support.  Spoke with Russell Stanley with live interpreter in room.  Russell Stanley described having a "Cancer infection."   When Chaplain inquired how Russell Stanley understands next steps, he stated that he is making a decision about treatment.  Described "treatment" as "shots / immunizations."   Russell Stanley stated he has an appointment at 1:30, but he was not able to say what this appointment is for.  Chaplain conferred with nursing.  Pt's 1:30 appointment is for radiation.    It is not clear that Russell Stanley is aware of the severity of his illness or treatment options.  Chaplain reported to pt's nurse as well as hospitalist, Dr. Rockne Menghini.   Chaplain and nursing present with pt as Dr. Rockne Menghini spoke with pt about illness.    Russell. Stanley is very concerned about financial security.  States that he works at [employer].  Understands that if he "takes treatment" he will not be able to work and states that he will not be able to pay rent and utilities for his family.  He does understand that "If I do not take treatments and return to work, I will only live for two or three months."    Pt is supported by his spouse.  States she does not have transportation to visit hospital.  He is Engineer, manufacturing and attends a church.  He states that a few members of his congregation know of his illness, but he is unsure what level of support they are able to provide for his family.  He lives in [place] neighborhood and states that one of his neighbors know he is ill.  Again, he does not know what level of support his community is able to provide during illness.   Creta Levin

## 2014-05-11 NOTE — Progress Notes (Signed)
Russell Stanley   DOB:11/23/1965   VQ#:[id number]    Subjective: He feels well. No interpreter is available.  Objective:  Filed Vitals:   05/11/14 0532  BP: 121/79  Pulse: 74  Temp: 97.6 F (36.4 C)  Resp: 16     Intake/Output Summary (Last 24 hours) at 05/11/14 0840 Last data filed at 05/10/14 1400  Gross per 24 hour  Intake   1640 ml  Output    700 ml  Net    940 ml    GENERAL:alert, no distress and comfortable Musculoskeletal:no cyanosis of digits and no clubbing  NEURO: alert & oriented x 3 with fluent speech, no focal motor/sensory deficits   Labs:  Lab Results  Component Value Date   WBC 6.4 05/06/2014   HGB 14.5 05/06/2014   HCT 41.6 05/06/2014   MCV 83.7 05/06/2014   PLT 198 05/06/2014   NEUTROABS 4.2 05/05/2014    Lab Results  Component Value Date   NA 135* 05/08/2014   K 4.0 05/08/2014   CL 101 05/08/2014   CO2 19 05/08/2014    Studies:  Ct Chest Wo Contrast  05/09/2014   CLINICAL DATA:  Evaluate chest mass  EXAM: CT CHEST WITHOUT CONTRAST  TECHNIQUE: Multidetector CT imaging of the chest was performed following the standard protocol without IV contrast.  COMPARISON:  None  FINDINGS: Mediastinum: The heart size appears normal. No pericardial effusion. The trachea appears patent and is midline. The esophagus appears normal. Sub- carinal lymph node measures 9 mm. There is a right paratracheal lymph node measuring 9 mm. Multiple, prominent left axillary lymph nodes measure up to 8 mm.  Lungs/Pleura: There is a mass within the left upper lobe measuring 2.4 x 2.3 by 4.4 cm. This mass extends into the left hilum. There is involvement and narrowing of the left upper lobe bronchi. Left upper lobe interlobular septal thickening is identified compatible with lymphangitic spread of tumor. Innumerable small nodules are identified throughout both lungs compatible with widespread metastatic disease. A very small left pleural effusion is noted.  Upper Abdomen: Incidental imaging  through the upper abdomen shows no acute findings. The adrenal glands appear normal. There is no mass.  Musculoskeletal: Evidence of multi focal lytic and sclerotic bone metastases are identified. Multiple lesions are seen throughout the thoracic spine. There is a lytic lesion involving the T10 vertebra with associated pathologic fracture through the superior endplate. T5 lytic lesion measures 0.9 cm.  IMPRESSION: 1. Left upper lobe lung mass is worrisome for primary pulmonary neoplasm. 2. Evidence of lymphangitic spread of tumor within the left upper lobe and diffuse, bilateral pulmonary metastasis. 3. Multi focal bone metastasis with pathologic fracture involving the superior endplate of I43.   Electronically Signed   By: Kerby Moors M.D.   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  I have requested pathology Department to send his tissue to [facility] for additional mutation study  I recommended Radiation Oncology consultation while in hospital. He will begin radiation soon. He can start slow dexamethasone taper as out-patient. I will see him in my office on November 4th to discuss chemotherapy options. Will sign off. Call if questions arise. [place], [place], MD 05/11/2014  8:40 AM

## 2014-05-11 NOTE — Telephone Encounter (Signed)
CALLED PATIENT TO SCHEDULE NP APPT, NOT A VALID NUMBER.

## 2014-05-11 NOTE — Telephone Encounter (Signed)
Message copied by Patton Salles on Fri May 11, 2014  2:08 PM ------      Message from: [facility], [place]      Created: Thu May 10, 2014  7:33 PM      Regarding: pathology       Accession: [id number]            Please call pathology to send his tissue to [facility] for additional mutation study.            Thanks ------

## 2014-05-11 NOTE — Telephone Encounter (Signed)
Pathology notified

## 2014-05-12 MED ORDER — ACETAMINOPHEN 325 MG PO TABS: 650.0000 mg | ORAL_TABLET | Freq: Four times a day (QID) | ORAL | Status: AC | PRN

## 2014-05-12 MED ORDER — DEXAMETHASONE 4 MG PO TABS
4.0000 mg | ORAL_TABLET | Freq: Four times a day (QID) | ORAL | Status: DC
Start: 2014-05-12 — End: 2014-06-12

## 2014-05-12 MED ORDER — DSS 100 MG PO CAPS: 100.0000 mg | ORAL_CAPSULE | Freq: Two times a day (BID) | ORAL | Status: DC

## 2014-05-12 MED ORDER — HYDROCODONE-ACETAMINOPHEN 5-325 MG PO TABS: 1.0000 | ORAL_TABLET | ORAL | Status: DC | PRN

## 2014-05-12 MED ORDER — PANTOPRAZOLE SODIUM 40 MG PO TBEC: 40.0000 mg | DELAYED_RELEASE_TABLET | Freq: Every day | ORAL | Status: AC

## 2014-05-12 NOTE — Progress Notes (Signed)
Interpreter phone used to call to assess pt's pain level. Operator states that [place] interpreter is not available at this time and to call back in 30-45min. Pt able to express that he was having a little pain, pt medicated at this time.

## 2014-05-12 NOTE — Plan of Care (Signed)
Problem: SLP Language Goals Goal: Ability to express needs and understand communication Outcome: Completed/Met Date Met:  05/12/14 Spoke to patient about d/c and plan using interpreter phone.     

## 2014-05-12 NOTE — Progress Notes (Signed)
Patient and family give Allyn Kenner, friend any information regarding care and treatment. Her number is listed on chart.

## 2014-05-12 NOTE — Discharge Instructions (Signed)
Metastatic Cancer, Questions and Answers KEY POINTS  Cancer happens when cells become abnormal and grow without control.  Where the cancer started is called the primary cancer or the primary tumor.  Metastatic cancer happens when cancer cells spread from the place where it started to other parts of the body.  When cancer spreads, the metastatic cancer keeps the same type of cells and the same name as the primary tumor.  The most common sites of metastasis are the lungs, bones, liver, and brain.  Treatment for metastatic cancer usually depends on the type of cancer. It also depends on the size and location of the metastasis. WHAT IS CANCER?   Cancer is a group of many related diseases. All cancers begin in cells. Cells are the building blocks that make up tissues. Cancer that arises from organs and solid tissues is called a solid tumor. Cancer that begins in blood cells is called leukemia, multiple myeloma, or lymphoma.  Normally, cells grow and divide to form new cells as the body needs them. When cells grow old and die, new cells take their place. Sometimes this orderly process goes wrong. New cells form when the body does not need them. Old cells do not die when they should.  The extra cells form a mass of tissue. This is called a growth or tumor. Tumors can be either not cancerous (benign) or cancerous (malignant). Benign tumors do not spread to other parts of the body. They are rarely a threat to life. Malignant tumors can spread (metastasize) and may be life threatening. WHAT IS PRIMARY CANCER?  Cancer can begin in any organ or tissue of the body. The original tumor is called the primary cancer or primary tumor. It is usually named for the part of the body or the type of cell in which it begins. WHAT IS METASTASIS, AND HOW DOES IT HAPPEN?   Metastasis means the spread of cancer. Cancer cells can break away from a primary tumor and enter the bloodstream or lymphatic system. This is the  system that produces, stores, and carries the cells that fight infections. That is how cancer cells spread to other parts of the body.  When cancer cells spread and form a new tumor in a different organ, the new tumor is a metastatic tumor. The cells in the metastatic tumor come from the original tumor. For example, if breast cancer spreads to the lungs, the metastatic tumor in the lung is made up of cancerous breast cells. It is not made of lung cells. In this case, the disease in the lungs is metastatic breast cancer (not lung cancer). Under a microscope, metastatic breast cancer cells generally look the same as the cancer cells in the breast. [place]?   Cancer cells can spread to almost any part of the body. Cancer cells frequently spread to lymph nodes (rounded masses of lymphatic tissue) near the primary tumor (regional lymph nodes). This is called lymph node involvement or regional disease. Cancer that spreads to other organs or to lymph nodes far from the primary tumor is called metastatic disease. Caregivers sometimes also call this distant disease.  The most common sites of metastasis from solid tumors are the lungs, bones, liver, and brain. Some cancers tend to spread to certain parts of the body. For example, lung cancer often metastasizes to the brain or bones. Colon cancer often spreads to the liver. Prostate cancer tends to spread to the bones. Breast cancer commonly spreads to the bones, lungs, liver,  the liver. Prostate cancer tends to spread to the bones. Breast cancer commonly spreads to the bones, lungs, liver, or brain. But each of these cancers can spread to other parts of the body as well.  · Because blood cells travel throughout the body, leukemia, multiple myeloma, and lymphoma cells are usually not localized when the cancer is diagnosed. Tumor cells may be found in the blood, several lymph nodes, or other parts of the body such as the liver or bones. This type of spread is not referred to as metastasis.  ARE THERE SYMPTOMS OF METASTATIC CANCER?   · Some people with metastatic  cancer do not have symptoms. Their metastases are found by X-rays and other tests performed for other reasons.  · When symptoms of metastatic cancer occur, the type and frequency of the symptoms will depend on the size and location of the metastasis. For example, cancer that spreads to the bones is likely to cause pain and can lead to bone fractures. Cancer that spreads to the brain can cause a variety of symptoms. These include headaches, seizures, and unsteadiness. Shortness of breath may be a sign of lung involvement. Abdominal swelling or yellowing of the skin (jaundice) can indicate that cancer has spread to the liver.  · Sometimes a person's primary cancer is discovered only after the metastatic tumor causes symptoms. For example, a man whose prostate cancer has spread to the bones in his pelvis may have lower back pain (caused by the cancer in his bones) before he experiences any symptoms from the primary tumor in his prostate.  HOW DOES THE CAREGIVER KNOW WHETHER A CANCER IS PRIMARY OR A METASTATIC TUMOR?  · To determine whether a tumor is primary or metastatic, the tumor will be examined under a microscope. In general, cancer cells look like abnormal versions of cells in the tissue where the cancer began. Using specialized diagnostic tests, a trained person is often able to tell where the cancer cells came from. Markers or antigens found in or on the cancer cells can indicate the primary site of the cancer.  · Metastatic cancers may be found before or at the same time as the primary tumor, or months or years later. When a new tumor is found in a patient who has been treated for cancer in the past, it is more often a metastasis than another primary tumor.  IS IT POSSIBLE TO HAVE A METASTATIC TUMOR WITHOUT HAVING A PRIMARY CANCER?   No. A metastatic tumor always starts from cancer cells in another part of the body. In most cases, when a metastatic tumor is found first, the primary tumor can be found. The  search for the primary tumor may involve lab tests, X-rays, and other procedures. However, in a small number of cases, a metastatic tumor is diagnosed but the primary tumor cannot be found, in spite of extensive tests. The tumor is metastatic because the cells are not like those in the organ or tissue in which the tumor is found. The primary tumor is called unknown or hidden (occult). The patient is said to have cancer of unknown primary origin (CUP). Because diagnostic techniques are constantly improving, the number of cases of CUP is going down.   WHAT TREATMENTS ARE USED FOR METASTATIC CANCER?   · When cancer has metastasized, it may be treated with:  ¨ Chemotherapy.  ¨ Radiation therapy.  ¨ Biological therapy.  ¨ Hormone therapy.  ¨ Surgery.  ¨ Cryosurgery.  ¨ A combination of these.  ·   The choice of treatment generally depends on the:  ¨ Type of primary cancer.  ¨ Size and location of the metastasis.  ¨ Patient's age and general health.  ¨ Types of treatments the patient has had in the past.  In patients with CUP, it is possible to treat the disease even though the primary tumor has not been located. The goal of treatment may be to control the cancer, or to relieve symptoms or side effects of treatment.  ARE NEW TREATMENTS FOR METASTATIC CANCER BEING DEVELOPED?   Yes, many new cancer treatments are under study. To develop new treatments, the NCI sponsors clinical trials (research studies) with cancer patients in many hospitals, universities, medical schools, and cancer centers around the country. Clinical trials are a critical step in the improvement of treatment. Before any new treatment can be recommended for general use, doctors conduct studies to find out whether the treatment is both safe for patients and effective against the disease. The results of such studies have led to progress not only in the treatment of cancer, but in the detection, diagnosis, and prevention of the disease as well. Patients  interested in taking part in a clinical trial should talk with their caregivers.  FOR MORE INFORMATION  National Cancer Institute (NCI): www.cancer.gov  Document Released: 11/10/2004 Document Revised: 09/28/2011 Document Reviewed: 06/28/2008  Exit[employer]® Patient Information ©2015 [employer]. This information is not intended to replace advice given to you by your health care provider. Make sure you discuss any questions you have with your health care provider.

## 2014-05-12 NOTE — Discharge Summary (Signed)
Physician Discharge Summary  Russell Stanley BJY:[id number] DOB: 06/25/1966 DOA: 05/05/2014  PCP: No primary provider on file.  Admit date: 05/05/2014 Discharge date: 05/12/2014   Recommendations for Outpatient Follow-Up:   1. The patient has followup for ongoing radiation treatment at the [facility] 05/14/14 and followup with Dr.Gorsuch 05/23/14. 2. It is imperative that a Education officer, museum evaluate his home situation as he has a wife and 3 children with no reliable transportation and he is the sole provider of his family. He will need assistance with transportation, and his family will need assistance with what will likely be a long treatment course with an overall poor prognosis. 3. Home RN/social worker ordered to ensure that this patient does not "fall through the cracks ".   Discharge Diagnosis:   Principal Problem:    Primary lung cancer with metastasis from lung to other site Active Problems:    Brain metastases    Exposure to TB   Discharge Condition: Improved.  Diet recommendation: Regular.   History of Present Illness:   Russell Stanley is an 48 y.o. male with a PMH of latent TB was admitted 05/05/14 with neck swelling/masses status post left neck lymph node biopsy suggesting adenocarcinoma with primary source felt to be the lung, with metastasis.  Hospital Course by Problem:   Principal Problem:  Primary lung cancer with metastasis from lung to other site  Patient has evidence of T10 and brain metastasis, and has begun radiation treatments to these areas.  He has been seen by Dr. Alvy Bimler with plans for outpatient palliative chemotherapy.  Despite the use of an interpreter to explain his treatment plan, I'm not entirely convinced that he understands the full scope and prognosis of his current disease status. He is at risk for "fallen through the cracks "due to his being the sole provider for his family and not having reliable transportation. Home health RN/social worker  requested for assessment of the safety of his home situation and to provide him with community resources.  Active Problems:  Exposure to TB  The patient is from [place], and has a + quantiferon test, indicative of prior exposure.  Left cervical biopsy negative for AFB.  Defer treatment for latent TB (which is given to reduce the lifetime risk of re-activation).  See Dr. Storm Frisk (ID) notes for details.  Brain metastases  Continue radiation treatment and Decadron. On GI prophylaxis.   Medical Consultants:    Dr. Arloa Koh, Radiation Oncology   Dr. Carlyle Basques, ID  Dr. Heath Lark, Oncology    Discharge Exam:   Filed Vitals:   05/12/14 1409  BP: 122/83  Pulse: 74  Temp: 97.8 F (36.6 C)  Resp: 18   Filed Vitals:   05/11/14 1447 05/11/14 2100 05/12/14 0605 05/12/14 1409  BP: 125/81 133/90 139/85 122/83  Pulse: 76 83 61 74  Temp: 97.7 F (36.5 C) 97.8 F (36.6 C) 97.8 F (36.6 C) 97.8 F (36.6 C)  TempSrc: Oral Oral Oral Oral  Resp: 18 18 18 18   Height:      Weight:      SpO2: 99% 99%  99%    Gen:  NAD Cardiovascular:  RRR, No M/R/G Respiratory: Lungs CTAB Gastrointestinal: Abdomen soft, NT/ND with normal active bowel sounds. Extremities: No C/E/C   The results of significant diagnostics from this hospitalization (including imaging, microbiology, ancillary and laboratory) are listed below for reference.     Procedures and Diagnostic Studies:   Ct Chest Wo Contrast 05/09/2014: 1. Left upper  lobe lung mass is worrisome for primary pulmonary neoplasm. 2. Evidence of lymphangitic spread of tumor within the left upper lobe and diffuse, bilateral pulmonary metastasis. 3. Multi focal bone metastasis with pathologic fracture involving the superior endplate of X38.   US Biopsy 05/07/2014: Status post percutaneous biopsy of left-sided lymph node, with tissue specimen sent to pathology for complete histopathologic analysis.   Labs:   Basic Metabolic  Panel:  Recent Labs Lab 05/06/14 0358 05/08/14 0525  NA 136* 135*  K 3.7 4.0  CL 101 101  CO2 22 19  GLUCOSE 176* 89  BUN 14 13  CREATININE 0.99 0.84  CALCIUM 9.4 9.2  MG 2.1  --   PHOS 3.3  --    GFR Estimated Creatinine Clearance: 94.9 ml/min (by C-G formula based on Cr of 0.84). Liver Function Tests:  Recent Labs Lab 05/06/14 0358  AST 45*  ALT 45  ALKPHOS 407*  BILITOT 0.4  PROT 7.7  ALBUMIN 3.6   Coagulation profile  Recent Labs Lab 05/06/14 1023  INR 1.09    CBC:  Recent Labs Lab 05/06/14 0358  WBC 6.4  HGB 14.5  HCT 41.6  MCV 83.7  PLT 198   Microbiology Recent Results (from the past 240 hour(s))  RAPID STREP SCREEN     Status: None   Collection Time    05/05/14  3:10 PM      Result Value Ref Range Status   Streptococcus, Group A Screen (Direct) NEGATIVE  NEGATIVE Final   Comment: (NOTE)     A Rapid Antigen test may result negative if the antigen level in the     sample is below the detection level of this test. The FDA has not     cleared this test as a stand-alone test therefore the rapid antigen     negative result has reflexed to a Group A Strep culture.  CULTURE, GROUP A STREP     Status: None   Collection Time    05/05/14  3:10 PM      Result Value Ref Range Status   Specimen Description THROAT   Final   Special Requests NONE   Final   Culture     Final   Value: No Beta Hemolytic Streptococci Isolated     Performed at [facility]   Report Status 05/07/2014 FINAL   Final  AFB CULTURE WITH SMEAR     Status: None   Collection Time    05/07/14  4:37 PM      Result Value Ref Range Status   Specimen Description TISSUE LYMPH NODE   Final   Special Requests Immunocompromised   Final   Acid Fast Smear     Final   Value: NO ACID FAST BACILLI SEEN     Performed at [facility]   Culture     Final   Value: CULTURE WILL BE EXAMINED FOR 6 WEEKS BEFORE ISSUING A FINAL REPORT     Performed at [facility]    Report Status PENDING   Incomplete  FUNGUS CULTURE W SMEAR     Status: None   Collection Time    05/07/14  4:37 PM      Result Value Ref Range Status   Specimen Description TISSUE LYMPH NODE   Final   Special Requests Immunocompromised   Final   Fungal Smear     Final   Value: NO YEAST OR FUNGAL ELEMENTS SEEN     Performed at [facility]   Culture     Final   Value: CULTURE IN PROGRESS FOR FOUR WEEKS     Performed at [facility]   Report Status PENDING   Incomplete  TISSUE CULTURE     Status: None   Collection Time    05/07/14  4:37 PM      Result Value Ref Range Status   Specimen Description TISSUE LYMPH NODE   Final   Special Requests NONE   Final   Gram Stain     Final   Value: NO WBC SEEN     NO ORGANISMS SEEN     Performed at [facility]   Culture     Final   Value: NO GROWTH 3 DAYS     Performed at [facility]   Report Status 05/11/2014 FINAL   Final     Discharge Instructions:   Discharge Instructions   Activity as tolerated - No restrictions    Complete by:  As directed      Call MD for:  persistant nausea and vomiting    Complete by:  As directed      Call MD for:  severe uncontrolled pain    Complete by:  As directed      Diet general    Complete by:  As directed      Discharge instructions    Complete by:  As directed   You were cared for by Dr. Jacquelynn Cree  (a hospitalist) during your hospital stay. If you have any questions about your discharge medications or the care you received while you were in the hospital after you are discharged, you can call the unit and ask to speak with the hospitalist on call if the hospitalist that took care of you is not available. Once you are discharged, your primary care physician will handle any further medical issues. Please note that NO REFILLS for any discharge medications will be authorized once you are discharged, as it is imperative that you return to your primary care physician (or  establish a relationship with a primary care physician if you do not have one) for your aftercare needs so that they can reassess your need for medications and monitor your lab values.  Any outstanding tests can be reviewed by your PCP at your follow up visit.  It is also important to review any medicine changes with your PCP.  Please bring these d/c instructions with you to your next visit so your physician can review these changes with you.  If you do not have a primary care physician, you can call [phone] for a physician referral.  It is highly recommended that you obtain a PCP for hospital follow up.     Face-to-face encounter (required for Medicare/Medicaid patients)    Complete by:  As directed   I RAMA,CHRISTINA certify that this patient is under my care and that I, or a nurse practitioner or physician's assistant working with me, had a face-to-face encounter that meets the physician face-to-face encounter requirements with this patient on 05/12/2014. The encounter with the patient was in whole, or in part for the following medical condition(s) which is the primary reason for home health care (List medical condition): The patient has newly diagnosed lung cancer, and does not have reliable transportation. He does not speak [place]. It is important that a social worker/nurse check on his home situation and make sure that appropriate followup issues are dressed and that he has transportation to his  appointments for radiation treatment.  The encounter with the patient was in whole, or in part, for the following medical condition, which is the primary reason for home health care:  Newly diagnosed lung cancer  I certify that, based on my findings, the following services are medically necessary home health services:  Nursing  My clinical findings support the need for the above services:  OTHER SEE COMMENTS  Further, I certify that my clinical findings support that this patient is homebound due to:  Pain  interferes with ambulation/mobility  Reason for Medically Necessary Home Health Services:  Other See Comments     Home Health    Complete by:  As directed   To provide the following care/treatments:  Social work            Medication List         acetaminophen 325 MG tablet  Commonly known as:  TYLENOL  Take 2 tablets (650 mg total) by mouth every 6 (six) hours as needed for mild pain (or Fever >/= 101).     dexamethasone 4 MG tablet  Commonly known as:  DECADRON  Take 1 tablet (4 mg total) by mouth 4 (four) times daily.     DSS 100 MG Caps  Take 100 mg by mouth 2 (two) times daily.     HYDROcodone-acetaminophen 5-325 MG per tablet  Commonly known as:  NORCO/VICODIN  Take 1-2 tablets by mouth every 4 (four) hours as needed for moderate pain.     pantoprazole 40 MG tablet  Commonly known as:  PROTONIX  Take 1 tablet (40 mg total) by mouth daily.           Follow-up Information   Follow up with [facility]. (At your appointment times noted below.)        Time coordinating discharge: 35 minutes.  Signed:  RAMA,CHRISTINA  Pager [phone] [facility] 05/12/2014, 5:29 PM

## 2014-05-13 ENCOUNTER — Encounter: Payer: Self-pay | Admitting: Radiation Oncology

## 2014-05-13 NOTE — Progress Notes (Signed)
Simulation verification note: The patient underwent simulation to verification for treatment to his brain and spine. His isocenter were in good position and the multileaf collimators contoured the treatment volume appropriately.

## 2014-05-13 NOTE — Progress Notes (Signed)
Treatment planning note: The patient completed his treatment planning on October 23. His whole brain was set up to lateral fields with 2 sets of unique MLCs for each field for a total of 4 complex treatment devices to his brain in addition to his head cast.

## 2014-05-14 ENCOUNTER — Encounter: Payer: Self-pay | Admitting: Radiation Oncology

## 2014-05-14 ENCOUNTER — Ambulatory Visit
Admission: RE | Admit: 2014-05-14 | Discharge: 2014-05-14 | Disposition: A | Discharge status: Home / Self Care | Payer: BC Managed Care – PPO | Source: Ambulatory Visit | Attending: Radiation Oncology | Admitting: Radiation Oncology

## 2014-05-14 ENCOUNTER — Inpatient Hospital Stay
Admission: RE | Admit: 2014-05-14 | Discharge: 2014-05-14 | Disposition: A | Discharge status: Home / Self Care | Payer: Self-pay | Source: Ambulatory Visit | Attending: Radiation Oncology | Admitting: Radiation Oncology

## 2014-05-14 VITALS — BP 122/81 | HR 87 | Temp 97.9°F | Resp 20 | Ht 66.0 in | Wt 139.8 lb

## 2014-05-14 DIAGNOSIS — R51 Headache: Secondary | ICD-10-CM | POA: Diagnosis not present

## 2014-05-14 DIAGNOSIS — Z510 Encounter for antineoplastic radiation therapy: Secondary | ICD-10-CM | POA: Insufficient documentation

## 2014-05-14 DIAGNOSIS — C7931 Secondary malignant neoplasm of brain: Secondary | ICD-10-CM | POA: Insufficient documentation

## 2014-05-14 DIAGNOSIS — C3492 Malignant neoplasm of unspecified part of left bronchus or lung: Secondary | ICD-10-CM | POA: Diagnosis not present

## 2014-05-14 NOTE — Progress Notes (Signed)
Weekly Management Note:  Site: Whole brain/T9-T11 Current Dose:  600/500  cGy Projected Dose: 3000/2500  cGy  Narrative: The patient is seen today for routine under treatment assessment. CBCT/MVCT images/port films were reviewed. The chart was reviewed.   He is without complaints today. He is currently taking 4 mg of dexamethasone by mouth 4 times a day. I will taper this to 4 mg by mouth twice a day beginning next Monday. He generally feels well. He is seen today with an interpreter.  Physical Examination:  Filed Vitals:   05/14/14 1620  BP: 122/81  Pulse: 87  Temp: 97.9 F (36.6 C)  Resp: 20  .  Weight: 139 lb 12.8 oz (63.413 kg). No change.  Impression: Tolerating radiation therapy well.  Plan: Continue radiation therapy as planned.

## 2014-05-14 NOTE — Progress Notes (Addendum)
Weekly rad tyxs brain and T-spine, 2/10 treatments completed,, taking decadron 4mg  qid as directed, no thrush seen on tongue,  no c/o pain,nausea, blurred vision, dizzy ness, , no fatigue stated by interpretere=Shree and with patient nodding head no to questions asked, Patient education done, radiation book and you  Given with Marletta Lor business card, daughter Nelda Severe and male friend/neighbor Allyn Kenner in with patient, discusses hair loss,fatigue and , nausea &skin irritation, diet,increase protein, may need to go to 5-6 smaller meals, inform MD if nausea occurs for Rx, all questions answered 4:25 PM

## 2014-05-15 ENCOUNTER — Ambulatory Visit: Payer: BC Managed Care – PPO

## 2014-05-16 ENCOUNTER — Telehealth: Payer: Self-pay | Admitting: Hematology and Oncology

## 2014-05-16 ENCOUNTER — Ambulatory Visit
Admit: 2014-05-16 | Discharge: 2014-05-16 | Disposition: A | Discharge status: Home / Self Care | Payer: BC Managed Care – PPO | Attending: Radiation Oncology | Admitting: Radiation Oncology

## 2014-05-16 DIAGNOSIS — Z510 Encounter for antineoplastic radiation therapy: Secondary | ICD-10-CM | POA: Diagnosis not present

## 2014-05-16 NOTE — Telephone Encounter (Signed)
PT CAME IN TODAY W/FRIEND REBECCA PITTARD TO R/S NP APPT FROM 11/5 TO 11/6. PT/FRIEND HAS NEW D/T.

## 2014-05-17 ENCOUNTER — Ambulatory Visit: Payer: BC Managed Care – PPO

## 2014-05-18 ENCOUNTER — Ambulatory Visit
Admit: 2014-05-18 | Discharge: 2014-05-18 | Disposition: A | Discharge status: Home / Self Care | Payer: BC Managed Care – PPO | Attending: Radiation Oncology | Admitting: Radiation Oncology

## 2014-05-18 DIAGNOSIS — Z510 Encounter for antineoplastic radiation therapy: Secondary | ICD-10-CM | POA: Diagnosis not present

## 2014-05-21 ENCOUNTER — Encounter: Payer: Self-pay | Admitting: Radiation Oncology

## 2014-05-21 ENCOUNTER — Ambulatory Visit
Admission: RE | Admit: 2014-05-21 | Discharge: 2014-05-21 | Disposition: A | Discharge status: Home / Self Care | Payer: BC Managed Care – PPO | Source: Ambulatory Visit | Attending: Radiation Oncology | Admitting: Radiation Oncology

## 2014-05-21 ENCOUNTER — Inpatient Hospital Stay
Admission: RE | Admit: 2014-05-21 | Discharge: 2014-05-21 | Disposition: A | Discharge status: Home / Self Care | Payer: Self-pay | Source: Ambulatory Visit | Attending: Radiation Oncology | Admitting: Radiation Oncology

## 2014-05-21 VITALS — BP 121/74 | HR 78 | Temp 97.9°F | Resp 20 | Wt 163.8 lb

## 2014-05-21 DIAGNOSIS — Z510 Encounter for antineoplastic radiation therapy: Secondary | ICD-10-CM | POA: Diagnosis not present

## 2014-05-21 DIAGNOSIS — C7931 Secondary malignant neoplasm of brain: Secondary | ICD-10-CM

## 2014-05-21 NOTE — Progress Notes (Addendum)
Language interpreter, Colin Rhein Mu with patient today as well as transporter.  Patient states he occasionally has headache in forehead area, takes Tylenol prn. He reports that after he eats he feels SOB, occasional abdominal pain. He takes Hydrocodone 1 tab twice daily and states it is for his abdominal pain. He is requesting refill today. Patient denies SOB otherwise, denies cough, nausea, dizziness, fatigue, loss of appetite. He is taking Decadron 4 mg 4 times a day. Post sim ed completed with patient and interpreter. Reviewed fatigue, nausea/management, skin irritation/care, nutrition, pain. No questions verbalized.

## 2014-05-21 NOTE — Progress Notes (Signed)
Weekly Management Note:  Site:whole brain/T9-T11 spine Current Dose:  1500/1250  cGy Projected Dose: 3000/2500  cGy  Narrative: The patient is seen today for routine under treatment assessment. CBCT/MVCT images/port films were reviewed. The chart was reviewed.   He is without complaints today except for mild frontal headaches which are as severe as 2/10. He has been on dexamethasone 4 mg by mouth 4 times a day. He does have vague abdominal discomfort. He does have 1 bowel movement a day, and it is unclear as to whether not he does have constipation. He does have hydrocodone which he takes when necessary abdominal discomfort.  Physical Examination:  Filed Vitals:   05/21/14 1310  BP: 121/74  Pulse: 78  Temp: 97.9 F (36.6 C)  Resp: 20  .  Weight: 163 lb 12.8 oz (74.299 kg). Alert and oriented. Head and neck examination unremarkable. There is no alopecia. Abdomen: Soft without masses organomegaly.  Impression: Tolerating radiation therapy well. I will go ahead and decrease his dexamethasone to 4 mg by mouth twice a day. I want him to stay away from hydrocodone which may be causing constipation. He will visit Dr. Alvy Bimler for medical oncology consultation this Thursday. I will see him this Thursday as well.   Plan: Continue radiation therapy as planned.

## 2014-05-22 ENCOUNTER — Ambulatory Visit
Admit: 2014-05-22 | Discharge: 2014-05-22 | Disposition: A | Discharge status: Home / Self Care | Payer: BC Managed Care – PPO | Attending: Radiation Oncology | Admitting: Radiation Oncology

## 2014-05-22 DIAGNOSIS — Z510 Encounter for antineoplastic radiation therapy: Secondary | ICD-10-CM | POA: Diagnosis not present

## 2014-05-23 ENCOUNTER — Ambulatory Visit
Admission: RE | Admit: 2014-05-23 | Discharge: 2014-05-23 | Disposition: A | Discharge status: Home / Self Care | Payer: BC Managed Care – PPO | Source: Ambulatory Visit | Attending: Radiation Oncology | Admitting: Radiation Oncology

## 2014-05-23 DIAGNOSIS — Z510 Encounter for antineoplastic radiation therapy: Secondary | ICD-10-CM | POA: Diagnosis not present

## 2014-05-24 ENCOUNTER — Ambulatory Visit: Payer: BC Managed Care – PPO

## 2014-05-24 ENCOUNTER — Inpatient Hospital Stay
Admission: RE | Admit: 2014-05-24 | Discharge: 2014-05-24 | Disposition: A | Discharge status: Home / Self Care | Payer: Self-pay | Source: Ambulatory Visit | Attending: Radiation Oncology | Admitting: Radiation Oncology

## 2014-05-24 ENCOUNTER — Ambulatory Visit: Payer: Medicaid Other

## 2014-05-24 ENCOUNTER — Ambulatory Visit: Payer: Medicaid Other | Admitting: Hematology and Oncology

## 2014-05-24 ENCOUNTER — Ambulatory Visit
Admission: RE | Admit: 2014-05-24 | Discharge: 2014-05-24 | Disposition: A | Discharge status: Home / Self Care | Payer: BC Managed Care – PPO | Source: Ambulatory Visit | Attending: Radiation Oncology | Admitting: Radiation Oncology

## 2014-05-24 DIAGNOSIS — Z510 Encounter for antineoplastic radiation therapy: Secondary | ICD-10-CM | POA: Diagnosis not present

## 2014-05-24 DIAGNOSIS — C7931 Secondary malignant neoplasm of brain: Secondary | ICD-10-CM

## 2014-05-24 NOTE — Progress Notes (Signed)
Weekly Management Note:  Site: whole brain/T9-T11 spine Current Dose:  2400/2000  cGy Projected Dose: 3000/2500  cGy  Narrative: The patient is seen today for routine under treatment assessment. CBCT/MVCT images/port films were reviewed. The chart was reviewed.   He is without new complaints today. He continues with his dexamethasone, 4 mg by mouth twice a day. He denies headaches, nausea, or vomiting.  Physical Examination: There were no vitals filed for this visit..  Weight:  . No change. No alopecia.  Impression: Tolerating radiation therapy well. He'll finish his radiation therapy this Monday.  Plan: Continue radiation therapy as planned.  One-month follow-up after completion of radiation therapy.

## 2014-05-25 ENCOUNTER — Telehealth: Payer: Self-pay | Admitting: Hematology and Oncology

## 2014-05-25 ENCOUNTER — Ambulatory Visit: Payer: BC Managed Care – PPO

## 2014-05-25 ENCOUNTER — Encounter: Payer: Self-pay | Admitting: Hematology and Oncology

## 2014-05-25 ENCOUNTER — Ambulatory Visit
Admission: RE | Admit: 2014-05-25 | Discharge: 2014-05-25 | Disposition: A | Discharge status: Home / Self Care | Payer: BC Managed Care – PPO | Source: Ambulatory Visit | Attending: Radiation Oncology | Admitting: Radiation Oncology

## 2014-05-25 ENCOUNTER — Encounter (HOSPITAL_COMMUNITY): Payer: Self-pay

## 2014-05-25 ENCOUNTER — Telehealth: Payer: Self-pay | Admitting: *Deleted

## 2014-05-25 ENCOUNTER — Ambulatory Visit (HOSPITAL_BASED_OUTPATIENT_CLINIC_OR_DEPARTMENT_OTHER): Payer: Medicaid Other | Admitting: Hematology and Oncology

## 2014-05-25 ENCOUNTER — Ambulatory Visit: Payer: Medicaid Other

## 2014-05-25 VITALS — BP 127/77 | HR 80 | Temp 97.9°F | Resp 18 | Ht 66.0 in | Wt 167.8 lb

## 2014-05-25 DIAGNOSIS — Z510 Encounter for antineoplastic radiation therapy: Secondary | ICD-10-CM | POA: Diagnosis not present

## 2014-05-25 DIAGNOSIS — C7931 Secondary malignant neoplasm of brain: Secondary | ICD-10-CM

## 2014-05-25 DIAGNOSIS — C3412 Malignant neoplasm of upper lobe, left bronchus or lung: Secondary | ICD-10-CM

## 2014-05-25 DIAGNOSIS — C3492 Malignant neoplasm of unspecified part of left bronchus or lung: Secondary | ICD-10-CM

## 2014-05-25 NOTE — Assessment & Plan Note (Signed)
I am waiting for results of additional mutation study. I plan to order PET/CT scan to complete staging and will review results with him and family. If he has no additional mutation detected, I plan to give him carboplatin/Alimta.

## 2014-05-25 NOTE — Telephone Encounter (Signed)
S/w Vada at [facility] to f/u on request for [facility] mutation studies.  She says the case was done at [facility] and she will call over there to find out status of this.

## 2014-05-25 NOTE — Assessment & Plan Note (Signed)
He is tolerating radiation well. Continue supportive care

## 2014-05-25 NOTE — Telephone Encounter (Signed)
gv adn prnted appt sched and avs fo rpt for NOV

## 2014-05-25 NOTE — Progress Notes (Signed)
Checked in new pt with no financial concerns at this time.  Pt has Medicaid so financial assistance may not be needed but he has my card for any questions or concerns.

## 2014-05-25 NOTE — Progress Notes (Signed)
[facility] OFFICE PROGRESS NOTE  Patient Care Team: No Pcp Per Patient as PCP - General (General Practice)  SUMMARY OF ONCOLOGIC HISTORY: Please see my original consult note dated 05/09/14 for further details. Russell Stanley is a 48 y.o. male non-English speaking from [place] admitted on 10/17 for evaluation of a large left neck mass. He had progressive, 4 month history of left neck swelling, neck pain and intermittent headaches. Per chart review and discussion with the patient via phone interpreter, he denied any fever but had intermittent chills. Denied night sweats, weight loss, productive cough or hemoptysis. He is recent onset of nonproductive cough for a week. He never smoked but chewed tobacco since a teenager. He denies any sick contacts. He denied any chest pain. No abdominal pain or appetite changes. Denies any numbness or tingling. He does have occasional lower back pain. No confusion is reported. He denies any vision changes. He denied any bleeding issues such as hemoptysis, epistaxis, melena or hematochezia. He is not aware of any family history of malignancies.   CT of the neck with contrast on 10/17 revealed a left upper lobe 4.8 x 4.2 x 3.2 cm mass highly concerning for primary malignancy with diffuse miliary pulmonary metastatic lesions bilaterally. Neck adenopathy greater on the left and most notable in the left supraclavicular region and left level 5 region where necrotic adenopathy with extracapsular spread. Small pericardial effusion was seen. Left hilar and subcarinal/pretracheal/AP window adenopathy was noted. Osseous metastatic disease to the T5 vertebral body was suspected . CT of the head on 10/18 without contrast showed a least 3 intracranial lesions, confirmed by an MRI of the brain with contrast, largest 13 mm at the left parietal area, with the other 2 being at the right parietal and right basal region.  No other staging CTs or tumor markers are  available for review, to rule out further metastatic disease.  He had a percutaneous biopsy of left-sided lymph node on 10/19, with tissue specimen sent to pathology for complete histopathologic analysis. Report, case number [id number] was consistent with metastatic adenocarcinoma of lung primary. The malignant cells are positive for TTF-1, Napsin-A, and cytokeratin 7. They are negative for cytokeratin 20, progesterone receptor, thyroglobulin, estrogen receptor, and GCDFP.  LDH is elevated at 271. Quantiferon is positive therefore he was placed on airborne precautions but was subsequently discontinued.    INTERVAL HISTORY: History is obtained via interpreter services Please see below for problem oriented charting. He is doing well. Denies neck pain. Apart from mild numbness in his left leg, he has no neurological deficits.  REVIEW OF SYSTEMS:   Constitutional: Denies fevers, chills or abnormal weight loss Eyes: Denies blurriness of vision Ears, nose, mouth, throat, and face: Denies mucositis or sore throat Respiratory: Denies cough, dyspnea or wheezes Cardiovascular: Denies palpitation, chest discomfort or lower extremity swelling Gastrointestinal:  Denies nausea, heartburn or change in bowel habits Skin: Denies abnormal skin rashes Lymphatics: Denies new lymphadenopathy or easy bruising Behavioral/Psych: Mood is stable, no new changes  All other systems were reviewed with the patient and are negative.  I have reviewed the past medical history, past surgical history, social history and family history with the patient and they are unchanged from previous note.  ALLERGIES:  has No Known Allergies.  MEDICATIONS:  Current Outpatient Prescriptions  Medication Sig Dispense Refill  . acetaminophen (TYLENOL) 325 MG tablet Take 2 tablets (650 mg total) by mouth every 6 (six) hours as needed for mild pain (or Fever >/=  101).    . dexamethasone (DECADRON) 4 MG tablet Take 1 tablet (4 mg total)  by mouth 4 (four) times daily. (Patient taking differently: Take 4 mg by mouth 2 (two) times daily. ) 120 tablet 0  . Docusate Sodium (DSS) 100 MG CAPS Take 100 mg by mouth 2 (two) times daily. Qty 60  3 refills Called into [facility]    . pantoprazole (PROTONIX) 40 MG tablet Take 1 tablet (40 mg total) by mouth daily. 30 tablet 2   No current facility-administered medications for this visit.    PHYSICAL EXAMINATION: ECOG PERFORMANCE STATUS: 0 - Asymptomatic  Filed Vitals:   05/25/14 1201  BP: 127/77  Pulse: 80  Temp: 97.9 F (36.6 C)  Resp: 18   Filed Weights   05/25/14 1201  Weight: 167 lb 12.8 oz (76.114 kg)    GENERAL:alert, no distress and comfortable SKIN: skin color, texture, turgor are normal, no rashes or significant lesions EYES: normal, Conjunctiva are pink and non-injected, sclera clear OROPHARYNX:no exudate, no erythema and lips, buccal mucosa, and tongue normal  NECK: supple, thyroid normal size, non-tender, without nodularity LYMPH:  Palpable lymphadenopathy in his neck LUNGS: clear to auscultation and percussion with normal breathing effort HEART: regular rate & rhythm and no murmurs and no lower extremity edema ABDOMEN:abdomen soft, non-tender and normal bowel sounds Musculoskeletal:no cyanosis of digits and no clubbing  NEURO: alert & oriented x 3 with fluent speech, no focal motor/sensory deficits  LABORATORY DATA:  I have reviewed the data as listed    Component Value Date/Time   NA 135* 05/08/2014 0525   K 4.0 05/08/2014 0525   CL 101 05/08/2014 0525   CO2 19 05/08/2014 0525   GLUCOSE 89 05/08/2014 0525   BUN 13 05/08/2014 0525   CREATININE 0.84 05/08/2014 0525   CALCIUM 9.2 05/08/2014 0525   PROT 7.7 05/06/2014 0358   ALBUMIN 3.6 05/06/2014 0358   AST 45* 05/06/2014 0358   ALT 45 05/06/2014 0358   ALKPHOS 407* 05/06/2014 0358   BILITOT 0.4 05/06/2014 0358   GFRNONAA >90 05/08/2014 0525   GFRAA >90 05/08/2014 0525     No results found for: SPEP, UPEP  Lab Results  Component Value Date   WBC 6.4 05/06/2014   NEUTROABS 4.2 05/05/2014   HGB 14.5 05/06/2014   HCT 41.6 05/06/2014   MCV 83.7 05/06/2014   PLT 198 05/06/2014      Chemistry      Component Value Date/Time   NA 135* 05/08/2014 0525   K 4.0 05/08/2014 0525   CL 101 05/08/2014 0525   CO2 19 05/08/2014 0525   BUN 13 05/08/2014 0525   CREATININE 0.84 05/08/2014 0525      Component Value Date/Time   CALCIUM 9.2 05/08/2014 0525   ALKPHOS 407* 05/06/2014 0358   AST 45* 05/06/2014 0358   ALT 45 05/06/2014 0358   BILITOT 0.4 05/06/2014 0358    ASSESSMENT & PLAN:  Brain metastases He is tolerating radiation well. Continue supportive care  Primary lung cancer with metastasis from lung to other site I am waiting for results of additional mutation study. I plan to order PET/CT scan to complete staging and will review results with him and family. If he has no additional mutation detected, I plan to give him carboplatin/Alimta.    Orders Placed This Encounter  Procedures  . NM PET Image Initial (PI) Skull Base To Thigh    Standing Status: Future     Number of  Occurrences:      Standing Expiration Date: 07/25/2015    Order Specific Question:  Reason for Exam (SYMPTOM  OR DIAGNOSIS REQUIRED)    Answer:  staging lung ca    Order Specific Question:  Preferred imaging location?    Answer:  [facility]   All questions were answered. The patient knows to call the clinic with any problems, questions or concerns. No barriers to learning was detected. I spent 30 minutes counseling the patient face to face. The total time spent in the appointment was 40 minutes and more than 50% was on counseling and review of test results     [facility], [place], MD 05/25/2014 8:38 PM

## 2014-05-25 NOTE — Telephone Encounter (Signed)
gv adn printed appt sched and avs fo rpt for NOV

## 2014-05-28 ENCOUNTER — Ambulatory Visit: Payer: BC Managed Care – PPO

## 2014-05-28 ENCOUNTER — Ambulatory Visit
Admission: RE | Admit: 2014-05-28 | Discharge: 2014-05-28 | Disposition: A | Discharge status: Home / Self Care | Payer: BC Managed Care – PPO | Source: Ambulatory Visit | Attending: Radiation Oncology | Admitting: Radiation Oncology

## 2014-05-28 DIAGNOSIS — Z510 Encounter for antineoplastic radiation therapy: Secondary | ICD-10-CM | POA: Diagnosis not present

## 2014-05-29 ENCOUNTER — Ambulatory Visit: Payer: Medicaid Other | Admitting: Internal Medicine

## 2014-05-31 ENCOUNTER — Telehealth: Payer: Self-pay | Admitting: *Deleted

## 2014-05-31 NOTE — Telephone Encounter (Signed)
S/w pt's church friend, Allyn Kenner, ph #[phone].  She reports pt wants Hospice care and does not want to pursue chemotherapy.  Dr. Alvy Bimler aware and instructs to refer to Hospice.  [place] with referral.  They will use language line to call pt to set up admission visit.   Informed Wells Guiles of PET scan canceled for tomorrow and f/u appt w/ Dr. Alvy Bimler canceled. She will let pt know.

## 2014-05-31 NOTE — Telephone Encounter (Signed)
Cameo, Can you verify, if they are ready, is it OK cancel PET scan and return follow-up. Then please refer him to hospice under my name. Thanks

## 2014-05-31 NOTE — Telephone Encounter (Signed)
Please proceed to cancel PET and rtn appt

## 2014-05-31 NOTE — Telephone Encounter (Signed)
Call from [place] at [facility].  They are planning on admitting pt to [facility] on Monday or Tuesday next week.  Pt's wife in hospital now about to deliver baby so they did not want Hospice to come out until next week.

## 2014-05-31 NOTE — Telephone Encounter (Signed)
Received call from Allyn Kenner, church friend who states "we want to refer patient to Hospice". She is inquiring if Dr Valere Dross can do that. Informed her Dr Valere Dross is out of office until 06/04/14. Advised she contact Dr Calton Dach RN for Hospice referral from Dr Alvy Bimler. Gave Ms Pittard Dr Calton Dach RN contact number. She verbalized understanding, agreement.

## 2014-06-01 ENCOUNTER — Encounter (HOSPITAL_COMMUNITY): Payer: Medicaid Other

## 2014-06-02 ENCOUNTER — Encounter: Payer: Self-pay | Admitting: Radiation Oncology

## 2014-06-02 NOTE — Progress Notes (Signed)
[facility] End of Treatment Note  Name:Russell Stanley  Date: 06/02/2014 YJE:[id number] DOB:04-08-1966   Status:outpatient    CC: No PCP Per Patient  Dr. Heath Lark   REFERRING PHYSICIAN: Dr. 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 He was scheduled for a PET scan prior to discussion of chemotherapy but the patient and his family requested referral to hospice which was kindly arranged by Dr. Alvy Bimler.                         PLAN: Routine followup in one month or as needed. Patient instructed to call if questions or worsening complaints in interim.

## 2014-06-04 LAB — FUNGUS CULTURE W SMEAR: FUNGAL SMEAR: NONE SEEN

## 2014-06-11 ENCOUNTER — Ambulatory Visit: Payer: No Typology Code available for payment source | Admitting: Hematology and Oncology

## 2014-06-12 ENCOUNTER — Telehealth: Payer: Self-pay | Admitting: *Deleted

## 2014-06-12 MED ORDER — DEXAMETHASONE 4 MG PO TABS: 4.0000 mg | ORAL_TABLET | Freq: Two times a day (BID) | ORAL | Status: DC

## 2014-06-12 NOTE — Telephone Encounter (Signed)
Recommend reduce to BID

## 2014-06-12 NOTE — Telephone Encounter (Signed)
Hospice RN requests refill on Dexamethasone 4 mg,  Pt is taking QID.

## 2014-06-12 NOTE — Telephone Encounter (Signed)
Left VM for Hospice RN, Leeanne Rio, informing of Dexamethasone refill decreased to BID and refill sent to [facility].

## 2014-06-18 ENCOUNTER — Encounter (HOSPITAL_COMMUNITY): Payer: Self-pay

## 2014-06-18 ENCOUNTER — Inpatient Hospital Stay (HOSPITAL_COMMUNITY)
Admission: EM | Admit: 2014-06-18 | Discharge: 2014-06-22 | DRG: 579 | Disposition: A | Discharge status: Hospice | Payer: Medicaid Other | Attending: Internal Medicine | Admitting: Internal Medicine

## 2014-06-18 ENCOUNTER — Emergency Department (HOSPITAL_COMMUNITY): Payer: Medicaid Other

## 2014-06-18 DIAGNOSIS — B9562 Methicillin resistant Staphylococcus aureus infection as the cause of diseases classified elsewhere: Secondary | ICD-10-CM | POA: Diagnosis present

## 2014-06-18 DIAGNOSIS — L0291 Cutaneous abscess, unspecified: Secondary | ICD-10-CM

## 2014-06-18 DIAGNOSIS — T380X5A Adverse effect of glucocorticoids and synthetic analogues, initial encounter: Secondary | ICD-10-CM | POA: Diagnosis present

## 2014-06-18 DIAGNOSIS — K219 Gastro-esophageal reflux disease without esophagitis: Secondary | ICD-10-CM | POA: Diagnosis present

## 2014-06-18 DIAGNOSIS — Z923 Personal history of irradiation: Secondary | ICD-10-CM | POA: Diagnosis not present

## 2014-06-18 DIAGNOSIS — E871 Hypo-osmolality and hyponatremia: Secondary | ICD-10-CM

## 2014-06-18 DIAGNOSIS — C3491 Malignant neoplasm of unspecified part of right bronchus or lung: Secondary | ICD-10-CM | POA: Diagnosis present

## 2014-06-18 DIAGNOSIS — R739 Hyperglycemia, unspecified: Secondary | ICD-10-CM | POA: Diagnosis present

## 2014-06-18 DIAGNOSIS — C7951 Secondary malignant neoplasm of bone: Secondary | ICD-10-CM | POA: Diagnosis present

## 2014-06-18 DIAGNOSIS — R748 Abnormal levels of other serum enzymes: Secondary | ICD-10-CM | POA: Diagnosis not present

## 2014-06-18 DIAGNOSIS — Z79899 Other long term (current) drug therapy: Secondary | ICD-10-CM

## 2014-06-18 DIAGNOSIS — Z66 Do not resuscitate: Secondary | ICD-10-CM | POA: Diagnosis present

## 2014-06-18 DIAGNOSIS — C3490 Malignant neoplasm of unspecified part of unspecified bronchus or lung: Secondary | ICD-10-CM | POA: Diagnosis not present

## 2014-06-18 DIAGNOSIS — C7931 Secondary malignant neoplasm of brain: Secondary | ICD-10-CM | POA: Diagnosis not present

## 2014-06-18 DIAGNOSIS — R609 Edema, unspecified: Secondary | ICD-10-CM

## 2014-06-18 DIAGNOSIS — C3492 Malignant neoplasm of unspecified part of left bronchus or lung: Secondary | ICD-10-CM | POA: Diagnosis present

## 2014-06-18 DIAGNOSIS — L0390 Cellulitis, unspecified: Secondary | ICD-10-CM | POA: Diagnosis not present

## 2014-06-18 DIAGNOSIS — E876 Hypokalemia: Secondary | ICD-10-CM | POA: Diagnosis not present

## 2014-06-18 DIAGNOSIS — L02512 Cutaneous abscess of left hand: Secondary | ICD-10-CM | POA: Diagnosis not present

## 2014-06-18 DIAGNOSIS — Z720 Tobacco use: Secondary | ICD-10-CM | POA: Diagnosis not present

## 2014-06-18 DIAGNOSIS — Z201 Contact with and (suspected) exposure to tuberculosis: Secondary | ICD-10-CM | POA: Diagnosis present

## 2014-06-18 DIAGNOSIS — L02519 Cutaneous abscess of unspecified hand: Secondary | ICD-10-CM

## 2014-06-18 DIAGNOSIS — I2699 Other pulmonary embolism without acute cor pulmonale: Secondary | ICD-10-CM | POA: Diagnosis present

## 2014-06-18 DIAGNOSIS — T798XXA Other early complications of trauma, initial encounter: Secondary | ICD-10-CM

## 2014-06-18 DIAGNOSIS — E222 Syndrome of inappropriate secretion of antidiuretic hormone: Secondary | ICD-10-CM | POA: Diagnosis not present

## 2014-06-18 DIAGNOSIS — L03012 Cellulitis of left finger: Secondary | ICD-10-CM | POA: Diagnosis present

## 2014-06-18 DIAGNOSIS — R000 Tachycardia, unspecified: Secondary | ICD-10-CM | POA: Insufficient documentation

## 2014-06-18 DIAGNOSIS — C7989 Secondary malignant neoplasm of other specified sites: Secondary | ICD-10-CM | POA: Diagnosis not present

## 2014-06-18 HISTORY — DX: Gastro-esophageal reflux disease without esophagitis: K21.9

## 2014-06-18 HISTORY — DX: Reserved for inherently not codable concepts without codable children: IMO0001

## 2014-06-18 LAB — C-REACTIVE PROTEIN: CRP: 18.6 mg/dL — ABNORMAL HIGH (ref <0.60)

## 2014-06-18 LAB — CBC WITH DIFFERENTIAL/PLATELET
BASOS ABS: 0 10*3/uL (ref 0.0–0.1)
Basophils Relative: 0 % (ref 0–1)
Eosinophils Absolute: 0 10*3/uL (ref 0.0–0.7)
Eosinophils Relative: 0 % (ref 0–5)
HEMATOCRIT: 37.9 % — AB (ref 39.0–52.0)
Hemoglobin: 12.9 g/dL — ABNORMAL LOW (ref 13.0–17.0)
LYMPHS PCT: 3 % — AB (ref 12–46)
Lymphs Abs: 0.5 10*3/uL — ABNORMAL LOW (ref 0.7–4.0)
MCH: 29.3 pg (ref 26.0–34.0)
MCHC: 34 g/dL (ref 30.0–36.0)
MCV: 85.9 fL (ref 78.0–100.0)
MONOS PCT: 3 % (ref 3–12)
Monocytes Absolute: 0.5 10*3/uL (ref 0.1–1.0)
NEUTROS ABS: 14.8 10*3/uL — AB (ref 1.7–7.7)
Neutrophils Relative %: 94 % — ABNORMAL HIGH (ref 43–77)
PLATELETS: 92 10*3/uL — AB (ref 150–400)
RBC: 4.41 MIL/uL (ref 4.22–5.81)
RDW: 14 % (ref 11.5–15.5)
WBC: 15.8 10*3/uL — AB (ref 4.0–10.5)

## 2014-06-18 LAB — BASIC METABOLIC PANEL
ANION GAP: 18 — AB (ref 5–15)
BUN: 9 mg/dL (ref 6–23)
CHLORIDE: 89 meq/L — AB (ref 96–112)
CO2: 22 meq/L (ref 19–32)
Calcium: 8.9 mg/dL (ref 8.4–10.5)
Creatinine, Ser: 0.42 mg/dL — ABNORMAL LOW (ref 0.50–1.35)
GFR calc non Af Amer: >90 mL/min (ref >90)
Glucose, Bld: 335 mg/dL — ABNORMAL HIGH (ref 70–99)
POTASSIUM: 3.6 meq/L — AB (ref 3.7–5.3)
Sodium: 129 mEq/L — ABNORMAL LOW (ref 137–147)

## 2014-06-18 LAB — SEDIMENTATION RATE: SED RATE: 115 mm/h — AB (ref 0–16)

## 2014-06-18 LAB — MAGNESIUM: Magnesium: 2.3 mg/dL (ref 1.5–2.5)

## 2014-06-18 MED ORDER — HYDROMORPHONE HCL 1 MG/ML IJ SOLN
1.0000 mg | INTRAMUSCULAR | Status: DC | PRN
  Administered 2014-06-18 – 2014-06-22 (×13): 1 mg via INTRAVENOUS
  Filled 2014-06-18 (×12): qty 1

## 2014-06-18 MED ORDER — VANCOMYCIN HCL IN DEXTROSE 1-5 GM/200ML-% IV SOLN
1000.0000 mg | Freq: Once | INTRAVENOUS | Status: DC
  Filled 2014-06-18: qty 200

## 2014-06-18 MED ORDER — OXYCODONE-ACETAMINOPHEN 5-325 MG PO TABS
2.0000 | ORAL_TABLET | Freq: Once | ORAL | Status: AC
  Administered 2014-06-18: 2 via ORAL
  Filled 2014-06-18: qty 2

## 2014-06-18 MED ORDER — ACETAMINOPHEN 325 MG PO TABS
650.0000 mg | ORAL_TABLET | Freq: Four times a day (QID) | ORAL | Status: DC | PRN
  Administered 2014-06-20 – 2014-06-21 (×3): 650 mg via ORAL
  Filled 2014-06-18 (×3): qty 2

## 2014-06-18 MED ORDER — ONDANSETRON HCL 4 MG PO TABS: 4.0000 mg | ORAL_TABLET | Freq: Four times a day (QID) | ORAL | Status: DC | PRN

## 2014-06-18 MED ORDER — SODIUM CHLORIDE 0.9 % IJ SOLN
3.0000 mL | Freq: Two times a day (BID) | INTRAMUSCULAR | Status: DC
  Administered 2014-06-18 – 2014-06-22 (×5): 3 mL via INTRAVENOUS

## 2014-06-18 MED ORDER — ACETAMINOPHEN 650 MG RE SUPP: 650.0000 mg | Freq: Four times a day (QID) | RECTAL | Status: DC | PRN

## 2014-06-18 MED ORDER — LIDOCAINE HCL 2 % IJ SOLN
10.0000 mL | Freq: Once | INTRAMUSCULAR | Status: DC
  Filled 2014-06-18: qty 20

## 2014-06-18 MED ORDER — PIPERACILLIN-TAZOBACTAM 3.375 G IVPB 30 MIN
3.3750 g | Freq: Once | INTRAVENOUS | Status: AC
  Administered 2014-06-18: 3.375 g via INTRAVENOUS
  Filled 2014-06-18: qty 50

## 2014-06-18 MED ORDER — PIPERACILLIN-TAZOBACTAM 3.375 G IVPB
3.3750 g | Freq: Three times a day (TID) | INTRAVENOUS | Status: DC
  Administered 2014-06-19 – 2014-06-21 (×8): 3.375 g via INTRAVENOUS
  Filled 2014-06-18 (×9): qty 50

## 2014-06-18 MED ORDER — VANCOMYCIN HCL IN DEXTROSE 1-5 GM/200ML-% IV SOLN
1000.0000 mg | Freq: Once | INTRAVENOUS | Status: AC
  Administered 2014-06-18: 1000 mg via INTRAVENOUS
  Filled 2014-06-18: qty 200

## 2014-06-18 MED ORDER — SODIUM CHLORIDE 0.9 % IV SOLN
INTRAVENOUS | Status: DC
  Administered 2014-06-18 – 2014-06-20 (×2): via INTRAVENOUS

## 2014-06-18 MED ORDER — DOCUSATE SODIUM 100 MG PO CAPS
100.0000 mg | ORAL_CAPSULE | Freq: Two times a day (BID) | ORAL | Status: DC
  Administered 2014-06-18 – 2014-06-22 (×8): 100 mg via ORAL
  Filled 2014-06-18 (×9): qty 1

## 2014-06-18 MED ORDER — HEPARIN SODIUM (PORCINE) 5000 UNIT/ML IJ SOLN
5000.0000 [IU] | Freq: Three times a day (TID) | INTRAMUSCULAR | Status: DC
  Administered 2014-06-18 – 2014-06-22 (×10): 5000 [IU] via SUBCUTANEOUS
  Filled 2014-06-18 (×15): qty 1

## 2014-06-18 MED ORDER — ONDANSETRON HCL 4 MG/2ML IJ SOLN: 4.0000 mg | Freq: Four times a day (QID) | INTRAMUSCULAR | Status: DC | PRN

## 2014-06-18 MED ORDER — MORPHINE SULFATE 15 MG PO TABS
7.5000 mg | ORAL_TABLET | ORAL | Status: DC | PRN
  Administered 2014-06-19 (×2): 15 mg via ORAL
  Administered 2014-06-19: 7.5 mg via ORAL
  Administered 2014-06-19 – 2014-06-20 (×4): 15 mg via ORAL
  Filled 2014-06-18 (×7): qty 1

## 2014-06-18 MED ORDER — HYDROMORPHONE HCL 1 MG/ML IJ SOLN
0.5000 mg | INTRAMUSCULAR | Status: DC | PRN
  Administered 2014-06-18: 0.5 mg via INTRAVENOUS
  Filled 2014-06-18 (×2): qty 1

## 2014-06-18 MED ORDER — VANCOMYCIN HCL IN DEXTROSE 1-5 GM/200ML-% IV SOLN
1000.0000 mg | Freq: Three times a day (TID) | INTRAVENOUS | Status: DC
  Administered 2014-06-18 – 2014-06-19 (×4): 1000 mg via INTRAVENOUS
  Filled 2014-06-18 (×5): qty 200

## 2014-06-18 NOTE — ED Notes (Signed)
Pt speaks Karenni.

## 2014-06-18 NOTE — H&P (Addendum)
[facility] History and Physical  Russell Stanley EXB:[id number] DOB: 01/23/1966 DOA: 06/18/2014  Referring physician:   PCP: No PCP Per Patient   Chief Complaint:  HPI:  48 y.o. 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 Not sure if they understand prognosis , seen in the ED for Left thumb abscess.left thumb swelling and redness has  been present for a couple days. It is swollen, darker in coloration, blistering, and painful. He finished radiation therapy roughly 3 weeks ago. His white count is elevated but without signs/symptoms of systemic illness.Seen by Dr Grandville Silos ,who performed  bedside I&D, admitted for iv abx .      Review of Systems: negative for the following  Constitutional: Denies fever, chills, diaphoresis, appetite change and fatigue.  HEENT: Denies photophobia, eye pain, redness, hearing loss, ear pain, congestion, sore throat, rhinorrhea, sneezing, mouth sores, trouble swallowing, neck pain, neck stiffness and tinnitus.  Respiratory: Denies SOB, DOE, cough, chest tightness, and wheezing.  Cardiovascular: Denies chest pain, palpitations and leg swelling.  Gastrointestinal: Denies nausea, vomiting, abdominal pain, diarrhea, constipation, blood in stool and abdominal distention.  Genitourinary: Denies dysuria, urgency, frequency, hematuria, flank pain and difficulty urinating.  Musculoskeletal: Denies myalgias, back pain, left thumb pain, arthralgias and gait problem.  Skin: Denies pallor, rash and wound.  Neurological: Denies dizziness, seizures, syncope, weakness, light-headedness, numbness and headaches.  Hematological: Denies adenopathy. Easy bruising, personal or family bleeding history  Psychiatric/Behavioral:  Denies suicidal ideation, mood changes, confusion, nervousness, sleep disturbance and agitation       Past Medical History  Diagnosis Date  . 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 Primary lung cancer with metastasis from lung to other site 05/11/2014  . Reflux      History reviewed. No pertinent past surgical history.    Social History:  reports that he has never smoked. His smokeless tobacco use includes Chew. He reports that he does not drink alcohol or use illicit drugs.    No Known Allergies  History reviewed. No pertinent family history.   Prior to Admission medications   Medication Sig Start Date End Date Taking? Authorizing Provider  acetaminophen (TYLENOL) 325 MG tablet Take 2 tablets (650 mg total) by mouth every 6 (six) hours as needed for mild pain (or Fever >/= 101). 05/12/14  Yes Christina P Rama, MD  dexamethasone (DECADRON) 4 MG tablet Take 1 tablet (4 mg total) by mouth 2 (two) times daily. 06/12/14  Yes Heath Lark, MD  Docusate Sodium (DSS) 100 MG CAPS Take 100 mg by mouth 2 (two) times daily. Qty 60  3 refills Called into [place] 05/18/14  Yes Lora Paula, MD  HYDROcodone-homatropine [facility]) 5-1.5 MG/5ML syrup Take 5 mLs by mouth every 6 (six) hours as needed for cough.   Yes Historical Provider, MD  morphine (MSIR) 15 MG tablet Take 7.5-15 mg by mouth every 4 (four) hours as needed for severe pain.   Yes Historical Provider, MD  pantoprazole (PROTONIX) 40 MG tablet Take 1 tablet (40 mg total) by mouth daily. 05/12/14  Yes Venetia Maxon Rama, MD     Physical Exam: Filed Vitals:   06/18/14 1211 06/18/14 1442  BP: 147/83 150/95  Pulse: 91 89  Temp: 98.3 F (36.8 C)  TempSrc: Oral   Resp:  16  SpO2: 97% 95%     Constitutional: Vital signs reviewed. Patient is a well-developed and well-nourished in no acute distress and cooperative with exam. Alert and oriented x3.  Head: Normocephalic and atraumatic  Ear: TM normal bilaterally   Mouth: no erythema or exudates, MMM  Eyes: PERRL, EOMI, conjunctivae normal, No scleral icterus.  Neck: Supple, Trachea midline normal ROM, No JVD, mass, thyromegaly, or carotid bruit present.  Cardiovascular: RRR, S1 normal, S2 normal, no MRG, pulses symmetric and intact bilaterally  Pulmonary/Chest: CTAB, no wheezes, rales, or rhonchi  Abdominal: Soft. Non-tender, non-distended, bowel sounds are normal, no masses, organomegaly, or guarding present.  GU: no CVA tenderness Left thumb is markedly swollen. There is an ulcer at the dorsal ulnar aspect of the base of the nail. It appears as if this may have been a paronychium that has extended as a subcutaneous abscess on the dorsum of the thumb, with injected dermis, blistering, and induration extending on the dorsum all the way to the MP joint level. The process does not appear to involve the volar compartment of the thumb. Ext: no edema and no cyanosis, pulses palpable bilaterally (DP and PT)  Hematology: no cervical, inginal, or axillary adenopathy.  Neurological: A&O x3, Strenght is normal and symmetric bilaterally, cranial nerve II-XII are grossly intact, no focal motor deficit, sensory intact to light touch bilaterally.  Skin: Warm, dry and intact. No rash, cyanosis, or clubbing.  Psychiatric: Normal mood and affect. speech and behavior is normal. Judgment and thought content normal. Cognition and memory are normal.       Labs on Admission:    Basic Metabolic Panel:  Recent Labs Lab 06/18/14 1308  NA 129*  K 3.6*  CL 89*  CO2 22  GLUCOSE 335*  BUN 9  CREATININE 0.42*  CALCIUM 8.9   Liver Function Tests: No results for input(s): AST, ALT, ALKPHOS, BILITOT, PROT, ALBUMIN in the last 168 hours. No results for input(s): LIPASE, AMYLASE in the last 168 hours. No results for input(s): AMMONIA in the last 168 hours. CBC:  Recent Labs Lab 06/18/14 1308  WBC 15.8*  NEUTROABS 14.8*  HGB 12.9*  HCT 37.9*  MCV 85.9  PLT 92*    Cardiac Enzymes: No results for input(s): CKTOTAL, CKMB, CKMBINDEX, TROPONINI in the last 168 hours.  BNP (last 3 results) No results for input(s): PROBNP in the last 8760 hours.    CBG: No results for input(s): GLUCAP in the last 168 hours.  Radiological Exams on Admission: Dg Hand Complete Left  06/18/2014   CLINICAL DATA:  Left thumb pain for 3 days. Swelling and redness in the left thumb.  EXAM: LEFT HAND - COMPLETE 3+ VIEW  COMPARISON:  None.  FINDINGS: Diffuse soft tissue swelling of the thumb. No evidence for a fracture or dislocation. No evidence for cortical destruction or periosteal reaction. Alignment of the left hand and is normal.  IMPRESSION: Soft tissue swelling without acute bone abnormality.   Electronically Signed   By: Markus Daft M.D.   On: 06/18/2014 14:26    EKG: Independently reviewed.    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

## 2014-06-18 NOTE — Consult Note (Signed)
ORTHOPAEDIC CONSULTATION HISTORY & PHYSICAL REQUESTING PHYSICIAN: Reyne Dumas, MD  Chief Complaint: Left thumb abscess  HPI: Russell Stanley is a 48 y.o. male in hospice care for lung cancer with metastases presented with a process involving his left thumb that he reports is only been present for a couple days. It is swollen, darker in coloration, blistering, and painful. He finished radiation therapy roughly 3 weeks ago. His white count is elevated but without signs/symptoms of systemic illness.  Past Medical History  Diagnosis Date  . 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 Primary lung cancer with metastasis from lung to other site 05/11/2014  . Reflux    History reviewed. No pertinent past surgical history. History   Social History  . Marital Status: Married    Spouse Name: N/A    Number of Children: N/A  . Years of Education: N/A   Social History Main Topics  . Smoking status: Never Smoker   . Smokeless tobacco: Current User    Types: Chew  . Alcohol Use: No  . Drug Use: No  . Sexual Activity: None   Other Topics Concern  . None   Social History Narrative   History reviewed. No pertinent family history. No Known Allergies Prior to Admission medications   Medication Sig Start Date End Date Taking? Authorizing Provider  acetaminophen (TYLENOL) 325 MG tablet Take 2 tablets (650 mg total) by mouth every 6 (six) hours as needed for mild pain (or Fever >/= 101). 05/12/14  Yes Christina P Rama, MD  dexamethasone (DECADRON) 4 MG tablet Take 1 tablet (4 mg total) by mouth 2 (two) times daily. 06/12/14  Yes Heath Lark, MD  Docusate Sodium (DSS) 100 MG CAPS Take 100 mg by mouth 2 (two) times daily. Qty 60  3 refills Called into [place] 05/18/14  Yes Lora Paula, MD  HYDROcodone-homatropine [facility]) 5-1.5 MG/5ML syrup Take 5 mLs by mouth every 6 (six) hours as needed for cough.   Yes Historical Provider, MD  morphine (MSIR) 15 MG tablet Take 7.5-15 mg by mouth  every 4 (four) hours as needed for severe pain.   Yes Historical Provider, MD  pantoprazole (PROTONIX) 40 MG tablet Take 1 tablet (40 mg total) by mouth daily. 05/12/14  Yes Venetia Maxon Rama, MD   Dg Hand Complete Left  06/18/2014   CLINICAL DATA:  Left thumb pain for 3 days. Swelling and redness in the left thumb.  EXAM: LEFT HAND - COMPLETE 3+ VIEW  COMPARISON:  None.  FINDINGS: Diffuse soft tissue swelling of the thumb. No evidence for a fracture or dislocation. No evidence for cortical destruction or periosteal reaction. Alignment of the left hand and is normal.  IMPRESSION: Soft tissue swelling without acute bone abnormality.   Electronically Signed   By: Markus Daft M.D.   On: 06/18/2014 14:26    Positive ROS: All other systems have been reviewed and were otherwise negative with the exception of those mentioned in the HPI and as above.  Physical Exam: Vitals: Refer to EMR. Constitutional:  WD, WN, NAD HEENT:  NCAT, EOMI Neuro/Psych:  Alert & oriented to person, place, and time; appropriate mood & affect Lymphatic: No generalized extremity edema or lymphadenopathy Extremities / MSK:  The extremities are normal with respect to appearance, ranges of motion, joint stability, muscle strength/tone, sensation, & perfusion except as otherwise noted:  Left thumb is markedly swollen. There is an ulcer at the dorsal ulnar aspect of the base of the nail. It appears  as if this may have been a paronychium that has extended as a subcutaneous abscess on the dorsum of the thumb, with injected dermis, blistering, and induration extending on the dorsum all the way to the MP joint level. The process does not appear to involve the volar compartment of the thumb.  Assessment: Left thumb dorsal subcutaneous abscess, likely extending from paronychium  Plan: Although this appears to be a subcutaneous abscess, the ED providers were not comfortable performing the bedside I&D.  Informed consent obtained via  telephonic interpreter. Plain lidocaine instilled as a digital block the base of the thumb then bedside I&D performed by me, with a mid axial ulnar approach. Thick pus was evident in the subcutaneous space and cultures were obtained. Spreading dissection was carried through subcutaneous space on the dorsum of the thumb just proximal to the level of the MP joint with a process ended, and at this point another longitudinal incision was made proximally as well as 1 more small incision radially. Through these 3 incisions, the subcutaneous space was copiously irrigated and the epidermis over the blistered area was excisionally debrided as well.  Iodoform packing was placed. Dressing was applied.  He will be admitted to medicine for definitive infection care, I suspect with IV antibiotics (recommend  Broad coverage like Zosyn/Vanc) until cultures allow for narrowing of coverage.  Given his co-morbidities, he may need more prolonged IV antibiotics, but I will defer that decision to his treating medical physician, perhaps with infectious diseases weighing in.  I will continue to follow him for the sake of managing the wound itself, likely pulling the packing on Wednesday in the hospital and seeing him in follow-up in the office for the wound.  [name], [place], [place]  [address] Office: [phone] Mobile: [phone]

## 2014-06-18 NOTE — ED Notes (Signed)
Per hospice care.  Bump on hand starting on Saturday.  It was slightly red.  Today hospice care found infection to left thumb.  Very little [place].

## 2014-06-18 NOTE — Progress Notes (Signed)
Utilization Review completed.  Shatona Andujar RN CM  

## 2014-06-18 NOTE — ED Notes (Signed)
Attempted to call report, RN unavailable at this time.

## 2014-06-18 NOTE — Progress Notes (Signed)
ANTIBIOTIC CONSULT NOTE - INITIAL  Pharmacy Consult for vancomycin and Zosyn Indication: cellulitis with associated abscess  No Known Allergies  Patient Measurements: Weight= 76.1kg on 05/25/14 Adjusted Body Weight: 63.8kg  Vital Signs: Temp: 98.3 F (36.8 C) (11/30 1211) Temp Source: Oral (11/30 1211) BP: 150/95 mmHg (11/30 1442) Pulse Rate: 89 (11/30 1442)  Labs:  Recent Labs  06/18/14 1308  WBC 15.8*  HGB 12.9*  PLT 92*  CREATININE 0.42*    Medical History: Past Medical History  Diagnosis Date  . 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 Primary lung cancer with metastasis from lung to other site 05/11/2014  . Reflux     Medications:  Scheduled:  . docusate sodium  100 mg Oral BID  . heparin  5,000 Units Subcutaneous 3 times per day  . lidocaine  10 mL Intradermal Once  . sodium chloride  3 mL Intravenous Q12H   Infusions:  . sodium chloride    . piperacillin-tazobactam     Assessment: Russell Stanley admitted 11/30 with L thumb cellulitis. Pt is currently under the care of hospice for lung cancer and reports removing a bump on his L thumb nail 2 days PTA. The area is edematous with purulent drainage. L hand Xray shows soft tissue swelling without bony abnormality. MD reports area as a subcutaneous abscess and I&D has not been performed. Noted patient with recent admission 10/17-10/24 for neck swelling, with some concerns for latent TB. Pharmacy is consulted to dose vancomycin and Zosyn for cellulitis with associated abscess.  First doses ordered in the ED  Antiinfectives  11/30 >> vancomycin >> 11/30 >> Zosyn >>    Labs / vitals Tmax: afebrile WBCs: elevated, 15.8k Renal: SCr 0.42 (baseline 0.8-0.9), CrCl >100 ml/min CG/N (using SCr=0.8)  Microbiology 11/30 L thumb wound: IP   Goal of Therapy:  Vancomycin trough level 15-20 mcg/ml Zosyn per indication and renal function  Plan:  - vancomycin 1g IV q8h - Zosyn 3.375G IV q8h, each dose to be infused over 4 hours -  vancomycin trough at steady state if indicated - follow-up clinical course, culture results, renal function - follow-up antibiotic de-escalation and length of therapy  Thank you for the consult.  Currie Paris, PharmD, BCPS Pager: [phone] Pharmacy: [phone] 06/18/2014 6:01 PM

## 2014-06-18 NOTE — ED Notes (Signed)
Attempted to call report again, RN unavailable.

## 2014-06-18 NOTE — ED Notes (Signed)
Surgeon at bedside.  

## 2014-06-18 NOTE — ED Notes (Signed)
Charge RN attempted to give report to 5th floor, RN still unavailable.

## 2014-06-18 NOTE — ED Notes (Signed)
Per hospice RN at pt bedside, pt c/o pain in L thumb since Saturday. Pt told hospice that he had a bump on thumb up by nail and he "pulled it off." Ever since, pain has increased and now there is significant swelling, redness, and drainage in L thumb and hand. Pt c/o pain up L arm.

## 2014-06-18 NOTE — ED Notes (Signed)
Pt spoken to by PA and RN using interpreter phone. PA explained that an xray of the hand would be performed, lab work drawn, IV started, and would culture obtained. Pt verified understanding and had no further questions.

## 2014-06-18 NOTE — Plan of Care (Signed)
Problem: Consults Goal: General Surgical Patient Education (See Patient Education module for education specifics) Outcome: Progressing Goal: Skin Care Protocol Initiated - if Braden Score 18 or less If consults are not indicated, leave blank or document N/A Outcome: Not Applicable Date Met:  71/95/97 Goal: Nutrition Consult-if indicated Outcome: Progressing Goal: Diabetes Guidelines if Diabetic/Glucose > 140 If diabetic or lab glucose is > 140 mg/dl - Initiate Diabetes/Hyperglycemia Guidelines & Document Interventions  Outcome: Progressing  Problem: Phase I Progression Outcomes Goal: Pain controlled with appropriate interventions Outcome: Progressing Goal: OOB as tolerated unless otherwise ordered Outcome: Completed/Met Date Met:  06/18/14 Goal: Incision/dressings dry and intact Outcome: Completed/Met Date Met:  06/18/14 Goal: Sutures/staples intact Outcome: Progressing Goal: Tubes/drains patent Outcome: Not Applicable Date Met:  47/18/55 Goal: Initial discharge plan identified Outcome: Progressing Goal: Voiding-avoid urinary catheter unless indicated Outcome: Completed/Met Date Met:  06/18/14 Goal: Vital signs/hemodynamically stable Outcome: Progressing

## 2014-06-18 NOTE — ED Provider Notes (Signed)
CSN: [id number]     Arrival date & time 06/18/14  1158 History   First MD Initiated Contact with Patient 06/18/14 1212     Chief Complaint  Patient presents with  . Wound Infection     (Consider location/radiation/quality/duration/timing/severity/associated sxs/prior Treatment) Pt speaks Karenni. Phone interpreter used for encounter. HPI  Russell Stanley is a 48 y.o. male with PMH of primary lung cancer with metastases who finished radiation treatment 3 weeks ago, pt under hospice care presenting with red lesion on ulnar side of left thumb nail which pt tore open himself two days ago. Pt noted increased swelling pain and purulent drainage from lesion. Erythema, swelling now encompasses entire dorsal thumb with generalized swelling to dorsal hand and thenar eminence. Pt denies fevers, chills, nausea, vomiting. Patient denies numbness, tingling, weakness,  headache, back pain, chest pain, shortness of breath, abdominal pain. Patient does note generalized weakness with all medications he's taking.   Past Medical History  Diagnosis Date  . 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 Primary lung cancer with metastasis from lung to other site 05/11/2014  . Reflux    History reviewed. No pertinent past surgical history. History reviewed. No pertinent family history. History  Substance Use Topics  . Smoking status: Never Smoker   . Smokeless tobacco: Current User    Types: Chew  . Alcohol Use: No    Review of Systems  Constitutional: Negative for fever and chills.  HENT: Negative for congestion and rhinorrhea.   Respiratory: Negative for shortness of breath.   Cardiovascular: Negative for chest pain.  Gastrointestinal: Negative for nausea, vomiting and diarrhea.  Musculoskeletal: Negative for back pain.  Skin: Positive for wound.  Neurological: Positive for weakness. Negative for headaches.      Allergies  Review of patient's allergies indicates no known allergies.  Home Medications   Prior to  Admission medications   Medication Sig Start Date End Date Taking? Authorizing Provider  acetaminophen (TYLENOL) 325 MG tablet Take 2 tablets (650 mg total) by mouth every 6 (six) hours as needed for mild pain (or Fever >/= 101). 05/12/14  Yes Christina P Rama, MD  dexamethasone (DECADRON) 4 MG tablet Take 1 tablet (4 mg total) by mouth 2 (two) times daily. 06/12/14  Yes Heath Lark, MD  Docusate Sodium (DSS) 100 MG CAPS Take 100 mg by mouth 2 (two) times daily. Qty 60  3 refills Called into [place] 05/18/14  Yes Lora Paula, MD  HYDROcodone-homatropine [facility]) 5-1.5 MG/5ML syrup Take 5 mLs by mouth every 6 (six) hours as needed for cough.   Yes Historical Provider, MD  morphine (MSIR) 15 MG tablet Take 7.5-15 mg by mouth every 4 (four) hours as needed for severe pain.   Yes Historical Provider, MD  pantoprazole (PROTONIX) 40 MG tablet Take 1 tablet (40 mg total) by mouth daily. 05/12/14  Yes Christina P Rama, MD   BP 150/95 mmHg  Pulse 89  Temp(Src) 98.3 F (36.8 C) (Oral)  Resp 16  SpO2 95% Physical Exam  Constitutional: He appears well-developed and well-nourished. No distress.  HENT:  Head: Normocephalic and atraumatic.  Eyes: Conjunctivae and EOM are normal. Right eye exhibits no discharge. Left eye exhibits no discharge.  Cardiovascular: Normal rate, regular rhythm and normal heart sounds.   Pulmonary/Chest: Effort normal and breath sounds normal. No respiratory distress. He has no wheezes.  Abdominal: Soft. Bowel sounds are normal. He exhibits no distension. There is no tenderness.  Neurological: He is alert. He exhibits  normal muscle tone. Coordination normal.  Pt able to flex and extend left thumb with pain. other fingers with normal strength. Sensation intact. <3 second cap refill. 2+ radial pulses equal bilaterally.  Skin: Skin is warm and dry. He is not diaphoretic.  See left thumb below.  Nursing note and vitals reviewed.        ED  Course  Procedures (including critical care time) Labs Review Labs Reviewed  CBC WITH DIFFERENTIAL - Abnormal; Notable for the following:    WBC 15.8 (*)    Hemoglobin 12.9 (*)    HCT 37.9 (*)    Platelets 92 (*)    Neutrophils Relative % 94 (*)    Lymphocytes Relative 3 (*)    Neutro Abs 14.8 (*)    Lymphs Abs 0.5 (*)    All other components within normal limits  BASIC METABOLIC PANEL - Abnormal; Notable for the following:    Sodium 129 (*)    Potassium 3.6 (*)    Chloride 89 (*)    Glucose, Bld 335 (*)    Creatinine, Ser 0.42 (*)    Anion gap 18 (*)    All other components within normal limits  SEDIMENTATION RATE - Abnormal; Notable for the following:    Sed Rate 115 (*)    All other components within normal limits  WOUND CULTURE  C-REACTIVE PROTEIN    Imaging Review Dg Hand Complete Left  06/18/2014   CLINICAL DATA:  Left thumb pain for 3 days. Swelling and redness in the left thumb.  EXAM: LEFT HAND - COMPLETE 3+ VIEW  COMPARISON:  None.  FINDINGS: Diffuse soft tissue swelling of the thumb. No evidence for a fracture or dislocation. No evidence for cortical destruction or periosteal reaction. Alignment of the left hand and is normal.  IMPRESSION: Soft tissue swelling without acute bone abnormality.   Electronically Signed   By: Markus Daft M.D.   On: 06/18/2014 14:26     EKG Interpretation None      MDM   Final diagnoses:  Swelling  Wound infection, initial encounter  Abscess of thumb, left  Cellulitis of thumb, left   Patient is a 48 year old male with history of lung cancer with metastases undergoing radiation that finished 3 weeks ago. He had possible paronychia on his left thumb 2 days ago and it has progressed to extensive cellulitis and abscess with purulent discharge. He denies fevers, chills, nausea, vomiting. No other complaints today. VSS. Neurovascularly intact. Patient with WBC count of 15.8 sedimentation rate 115. X-ray without signs of cortical  destruction or periosteal reaction. Consult to Copy. Spoke with Dr. Grandville Silos who will evaluate patient for bedside I and D and plan for admission with IV antibiotics. Dr. Grandville Silos bedside [phone]. Consult to hospitalist. Spoke with Dr. Reyne Dumas, MD who agrees with the plan and for admission.   Discussed all results and patient verbalizes understanding and agrees with plan.  This is a shared patient. This patient was discussed with the physician, Dr. Mingo Amber who saw and evaluated the patient and agrees with the plan.      Pura Spice, PA-C 06/18/14 [place], PA-C 06/18/14 2033  Pura Spice, PA-C 06/18/14 2034  Evelina Bucy, MD 06/19/14 [phone]

## 2014-06-19 ENCOUNTER — Telehealth: Payer: Self-pay | Admitting: *Deleted

## 2014-06-19 LAB — CBC
HCT: 31.3 % — ABNORMAL LOW (ref 39.0–52.0)
Hemoglobin: 10.6 g/dL — ABNORMAL LOW (ref 13.0–17.0)
MCH: 29.4 pg (ref 26.0–34.0)
MCHC: 33.9 g/dL (ref 30.0–36.0)
MCV: 86.7 fL (ref 78.0–100.0)
Platelets: 84 10*3/uL — ABNORMAL LOW (ref 150–400)
RBC: 3.61 MIL/uL — AB (ref 4.22–5.81)
RDW: 14 % (ref 11.5–15.5)
WBC: 11.5 10*3/uL — AB (ref 4.0–10.5)

## 2014-06-19 LAB — GLUCOSE, CAPILLARY
Glucose-Capillary: 198 mg/dL — ABNORMAL HIGH (ref 70–99)
Glucose-Capillary: 248 mg/dL — ABNORMAL HIGH (ref 70–99)
Glucose-Capillary: 160 mg/dL — ABNORMAL HIGH (ref 70–99)

## 2014-06-19 LAB — AFB CULTURE WITH SMEAR (NOT AT ARMC): Acid Fast Smear: NONE SEEN

## 2014-06-19 LAB — COMPREHENSIVE METABOLIC PANEL
ALT: 45 U/L (ref 0–53)
ANION GAP: 13 (ref 5–15)
AST: 22 U/L (ref 0–37)
Albumin: 2.1 g/dL — ABNORMAL LOW (ref 3.5–5.2)
Alkaline Phosphatase: 512 U/L — ABNORMAL HIGH (ref 39–117)
BUN: 12 mg/dL (ref 6–23)
CO2: 27 meq/L (ref 19–32)
CREATININE: 0.45 mg/dL — AB (ref 0.50–1.35)
Calcium: 8.5 mg/dL (ref 8.4–10.5)
Chloride: 92 mEq/L — ABNORMAL LOW (ref 96–112)
Glucose, Bld: 206 mg/dL — ABNORMAL HIGH (ref 70–99)
Potassium: 3.9 mEq/L (ref 3.7–5.3)
Sodium: 132 mEq/L — ABNORMAL LOW (ref 137–147)
Total Bilirubin: 0.5 mg/dL (ref 0.3–1.2)
Total Protein: 6.8 g/dL (ref 6.0–8.3)

## 2014-06-19 LAB — HEMOGLOBIN A1C
Hgb A1c MFr Bld: 7.5 % — ABNORMAL HIGH (ref <5.7)
Mean Plasma Glucose: 169 mg/dL — ABNORMAL HIGH (ref <117)

## 2014-06-19 MED ORDER — INSULIN ASPART 100 UNIT/ML ~~LOC~~ SOLN
0.0000 [IU] | Freq: Three times a day (TID) | SUBCUTANEOUS | Status: DC
  Administered 2014-06-19: 3 [IU] via SUBCUTANEOUS
  Administered 2014-06-19: 5 [IU] via SUBCUTANEOUS
  Administered 2014-06-20: 3 [IU] via SUBCUTANEOUS
  Administered 2014-06-21 – 2014-06-22 (×2): 5 [IU] via SUBCUTANEOUS

## 2014-06-19 MED ORDER — INSULIN GLARGINE 100 UNIT/ML ~~LOC~~ SOLN
20.0000 [IU] | Freq: Every day | SUBCUTANEOUS | Status: DC
  Administered 2014-06-19 – 2014-06-20 (×2): 20 [IU] via SUBCUTANEOUS
  Filled 2014-06-19 (×2): qty 0.2

## 2014-06-19 NOTE — Progress Notes (Addendum)
Inpatient RN visit-[facility] [place] 5 E Room [place] [facility] RN Visit-Karen Alford [place] RN. Pt sent to the [facility] ED on 11/30 for evaluation of a rapidly changing infection to his left thumb, I+D performed in ED, pt placed on IV abt. This is a NON Related, non covered admission to [facility] diagnosis of Lung Cancer. Of note Pt is Full Code with hospice, per chart review pt is DNR this hospitalization..    Pt seen at bedside sitting up in bed, alert, smiled easily and greeted Probation officer.Left hand with dressing/gauze wrap dry and intact. Language line interpreter [phone]) assisted as patient speaks karreni. Pt denied pain or any needs, reports he is comfortable. Writer spoke with staff RN Raquel Sarna who reports that patient has Q 4 hr PRN pain medication and has been recvg it regularly, with highest number of 3/10 for pain. Per chart review patient is eating and drinking. Non productive cough noted during visit. Pt remains on IV abt for treatment of infection, cultures pending. [facility] will continue to follow. Patient's home medication list, and transfer summary in place  on shadow chart.  Please call [facility] @ [phone]- ask for RN Liaison or after hours,ask for on-call RN with any hospice needs.   Thank you. Tracey Harries, RN  [facility]  Hospice Liaison  [phone])

## 2014-06-19 NOTE — Progress Notes (Addendum)
06-18-14--ED bedside I&D of large dorsal abscess of left thumb GM stain = GPC in pairs    Cx pending Packing pulled, and dressing changed Tissue still friable, easily bleeds  Wound: 1. Begin daily wound care with [facility] nurse consult for recommendations, would benefit from hand whirlpool if available 2. I will follow for wound management and functional restoration  Infection management per primary team (presently on Zosyn and Vanc and cx pending)

## 2014-06-19 NOTE — Progress Notes (Addendum)
[facility] Progress note  Elward Nocera WUJ:[id number] DOB: 1965/10/03 DOA: 06/18/2014  Referring physician:   PCP: No PCP Per Patient   Chief Complaint:  HPI:  48 y.o. 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 Not sure if they understand prognosis , seen in the ED for Left thumb abscess.left thumb swelling and redness has  been present for a couple days. It is swollen, darker in coloration, blistering, and painful. He finished radiation therapy roughly 3 weeks ago. His white count is elevated but without signs/symptoms of systemic illness.Seen by Dr Grandville Silos ,who performed  bedside I&D, admitted for iv abx .  Subjective Pleasant, comfortable, no complaints  Left thumb dorsal subcutaneous abscess, likely extending from [place] and zosyn Blood cx drawn, follow culture Not a diabetic , CBG elevated likely secondary to steroids that the patient has been receiving Patient has been on Decadron Significantly elevated CRP at 18.6 Leukocytosis improving Dressing change recommendations to be given by Dr. Grandville Silos  Hyperglycemia Check hemoglobin A1c Start patient on Lantus sliding scale insulin   Hyponatremia  Due to SIADH, improving with IV hydration  Elevated alkaline phosphatase Likely secondary to bony metastasis  On home hospice   Code Status:   dnr  Family Communication: bedside Disposition Plan: admit     Prior to Admission medications   Medication Sig Start Date End Date Taking? Authorizing Provider  acetaminophen (TYLENOL) 325 MG tablet Take 2 tablets (650 mg total) by mouth every 6 (six) hours as needed for mild pain (or Fever >/= 101). 05/12/14  Yes Christina P Rama, MD  dexamethasone  (DECADRON) 4 MG tablet Take 1 tablet (4 mg total) by mouth 2 (two) times daily. 06/12/14  Yes Heath Lark, MD  Docusate Sodium (DSS) 100 MG CAPS Take 100 mg by mouth 2 (two) times daily. Qty 60  3 refills Called into [place] 05/18/14  Yes Lora Paula, MD  HYDROcodone-homatropine [facility]) 5-1.5 MG/5ML syrup Take 5 mLs by mouth every 6 (six) hours as needed for cough.   Yes Historical Provider, MD  morphine (MSIR) 15 MG tablet Take 7.5-15 mg by mouth every 4 (four) hours as needed for severe pain.   Yes Historical Provider, MD  pantoprazole (PROTONIX) 40 MG tablet Take 1 tablet (40 mg total) by mouth daily. 05/12/14  Yes Venetia Maxon Rama, MD     Physical Exam: Filed Vitals:   06/18/14 1950 06/18/14 2033 06/19/14 0503 06/19/14 0833  BP: 143/90  140/90   Pulse: 96 95 97   Temp: 98.5 F (36.9 C)  99.2 F (37.3 C)   TempSrc: Oral  Oral   Resp: 18  18   Height:    5\' 5"  (1.651 m)  Weight:    74.571 kg (164 lb 6.4 oz)  SpO2: 98%  95%      Constitutional: Vital signs reviewed. Patient is a well-developed and well-nourished in no acute distress and cooperative with exam. Alert and oriented x3.  Head: Normocephalic and atraumatic  Ear: TM normal bilaterally  Mouth: no erythema or exudates, MMM  Eyes: PERRL, EOMI, conjunctivae normal, No scleral icterus.  Neck: Supple, Trachea midline normal ROM, No JVD, mass, thyromegaly, or carotid bruit present.  Cardiovascular: RRR, S1 normal, S2 normal,  no MRG, pulses symmetric and intact bilaterally  Pulmonary/Chest: CTAB, no wheezes, rales, or rhonchi  Abdominal: Soft. Non-tender, non-distended, bowel sounds are normal, no masses, organomegaly, or guarding present.  GU: no CVA tenderness Left thumb is markedly swollen. There is an ulcer at the dorsal ulnar aspect of the base of the nail. It appears as if this may have been a paronychium that has extended as a subcutaneous abscess on the dorsum of the thumb, with injected  dermis, blistering, and induration extending on the dorsum all the way to the MP joint level. The process does not appear to involve the volar compartment of the thumb. Ext: no edema and no cyanosis, pulses palpable bilaterally (DP and PT)  Hematology: no cervical, inginal, or axillary adenopathy.  Neurological: A&O x3, Strenght is normal and symmetric bilaterally, cranial nerve II-XII are grossly intact, no focal motor deficit, sensory intact to light touch bilaterally.  Skin: Warm, dry and intact. No rash, cyanosis, or clubbing.  Psychiatric: Normal mood and affect. speech and behavior is normal. Judgment and thought content normal. Cognition and memory are normal.       Labs on Admission:    Basic Metabolic Panel:  Recent Labs Lab 06/18/14 1308 06/18/14 1902 06/19/14 0500  NA 129*  --  132*  K 3.6*  --  3.9  CL 89*  --  92*  CO2 22  --  27  GLUCOSE 335*  --  206*  BUN 9  --  12  CREATININE 0.42*  --  0.45*  CALCIUM 8.9  --  8.5  MG  --  2.3  --    Liver Function Tests:  Recent Labs Lab 06/19/14 0500  AST 22  ALT 45  ALKPHOS 512*  BILITOT 0.5  PROT 6.8  ALBUMIN 2.1*   No results for input(s): LIPASE, AMYLASE in the last 168 hours. No results for input(s): AMMONIA in the last 168 hours. CBC:  Recent Labs Lab 06/18/14 1308 06/19/14 0500  WBC 15.8* 11.5*  NEUTROABS 14.8*  --   HGB 12.9* 10.6*  HCT 37.9* 31.3*  MCV 85.9 86.7  PLT 92* 84*   Cardiac Enzymes: No results for input(s): CKTOTAL, CKMB, CKMBINDEX, TROPONINI in the last 168 hours.  BNP (last 3 results) No results for input(s): PROBNP in the last 8760 hours.    CBG:  Recent Labs Lab 06/19/14 0852 06/19/14 1125  GLUCAP 198* 248*    Radiological Exams on Admission: Dg Hand Complete Left  06/18/2014   CLINICAL DATA:  Left thumb pain for 3 days. Swelling and redness in the left thumb.  EXAM: LEFT HAND - COMPLETE 3+ VIEW  COMPARISON:  None.  FINDINGS: Diffuse soft tissue swelling of the  thumb. No evidence for a fracture or dislocation. No evidence for cortical destruction or periosteal reaction. Alignment of the left hand and is normal.  IMPRESSION: Soft tissue swelling without acute bone abnormality.   Electronically Signed   By: Markus Daft M.D.   On: 06/18/2014 14:26    EKG: Independently reviewed.    Assessment/Plan Active Problems:   Cellulitis  Time spent: 70 mins   [facility] Pager [phone]   If 7PM-7AM, please contact night-coverage [email or web address] Password [facility] 06/19/2014, 12:47 PM

## 2014-06-19 NOTE — Progress Notes (Signed)
Entered pt's room to administer IV abx. Pt was taking home medications. Pt had already taken Protonix, Colace, and was taking Tussionex when I walked in.  Attempted to tell pt not to take medications. Attempted to call [facility] [employer] to inform pt about home medications and complete admission history. [place] interpreters stated that they did not have an interpreter for [place] at this time. Home meds still in room since I was not able to explain why I would be taking them away. Will attempt to call interpreter line again.  Will continue to monitor.   Roselind Rily

## 2014-06-19 NOTE — Progress Notes (Signed)
CARE MANAGEMENT NOTE 06/19/2014  Patient:  Russell Stanley, Russell Stanley   Account Number:  [id number]  Date Initiated:  06/19/2014  Documentation initiated by:  Edwyna Shell  Subjective/Objective Assessment:   48 yo male from home admitted with cellulitis of left thumb. Speaks karenni.     Action/Plan:   discharge planning   Anticipated DC Date:  06/22/2014   Anticipated DC Plan:  [place]  CM consult      Choice offered to / List presented to:             Status of service:  In process, will continue to follow Medicare Important Message given?   (If response is "NO", the following Medicare IM given date fields will be blank) Date Medicare IM given:   Medicare IM given by:   Date Additional Medicare IM given:   Additional Medicare IM given by:    Discharge Disposition:    Per UR Regulation:    If discussed at Long Length of Stay Meetings, dates discussed:    Comments:  06/19/14 Edwyna Shell RN BSN CM [phone] Spoke with patient via use of telephone interpreter for karenni. Patient stated that he lives at home with his family and has a nephew that speaks english. He stated that he cannot read english. He has Medicaid and assigned Medicaid provider is the [facility] but patient unable to establish care due to language barrier. This CM will attempt to establish services with the [facility]. Patient stated that he has hospice services with hospice and palliative care of [place]. Spoke with Santiago Glad, [facility] liaison, and she stated that she will follow up with the patient today. Will continue to follow for any discharge needs.

## 2014-06-19 NOTE — Telephone Encounter (Signed)
Hospice RN left VM to inform Dr. Alvy Bimler pt was admitted to [facility] yesterday for severe infection in his left thumb.  He had I & D done in ED and then admitted for IV antibiotics according to Hospice RN.

## 2014-06-20 DIAGNOSIS — C799 Secondary malignant neoplasm of unspecified site: Secondary | ICD-10-CM

## 2014-06-20 DIAGNOSIS — L02519 Cutaneous abscess of unspecified hand: Secondary | ICD-10-CM

## 2014-06-20 DIAGNOSIS — L0291 Cutaneous abscess, unspecified: Secondary | ICD-10-CM

## 2014-06-20 DIAGNOSIS — L0390 Cellulitis, unspecified: Secondary | ICD-10-CM

## 2014-06-20 DIAGNOSIS — E871 Hypo-osmolality and hyponatremia: Secondary | ICD-10-CM

## 2014-06-20 DIAGNOSIS — C3490 Malignant neoplasm of unspecified part of unspecified bronchus or lung: Secondary | ICD-10-CM

## 2014-06-20 LAB — COMPREHENSIVE METABOLIC PANEL
ALK PHOS: 591 U/L — AB (ref 39–117)
ALT: 46 U/L (ref 0–53)
AST: 42 U/L — ABNORMAL HIGH (ref 0–37)
Albumin: 2 g/dL — ABNORMAL LOW (ref 3.5–5.2)
Anion gap: 15 (ref 5–15)
BUN: 11 mg/dL (ref 6–23)
CHLORIDE: 93 meq/L — AB (ref 96–112)
CO2: 24 meq/L (ref 19–32)
Calcium: 8.7 mg/dL (ref 8.4–10.5)
Creatinine, Ser: 0.49 mg/dL — ABNORMAL LOW (ref 0.50–1.35)
GFR calc Af Amer: >90 mL/min (ref >90)
Glucose, Bld: 117 mg/dL — ABNORMAL HIGH (ref 70–99)
Potassium: 3.6 mEq/L — ABNORMAL LOW (ref 3.7–5.3)
SODIUM: 132 meq/L — AB (ref 137–147)
Total Bilirubin: 0.6 mg/dL (ref 0.3–1.2)
Total Protein: 7.1 g/dL (ref 6.0–8.3)

## 2014-06-20 LAB — CBC
HCT: 35.4 % — ABNORMAL LOW (ref 39.0–52.0)
Hemoglobin: 12.2 g/dL — ABNORMAL LOW (ref 13.0–17.0)
MCH: 29.8 pg (ref 26.0–34.0)
MCHC: 34.5 g/dL (ref 30.0–36.0)
MCV: 86.6 fL (ref 78.0–100.0)
PLATELETS: 95 10*3/uL — AB (ref 150–400)
RBC: 4.09 MIL/uL — ABNORMAL LOW (ref 4.22–5.81)
RDW: 14 % (ref 11.5–15.5)
WBC: 13.4 10*3/uL — ABNORMAL HIGH (ref 4.0–10.5)

## 2014-06-20 LAB — GLUCOSE, CAPILLARY
GLUCOSE-CAPILLARY: 238 mg/dL — AB (ref 70–99)
Glucose-Capillary: 281 mg/dL — ABNORMAL HIGH (ref 70–99)
Glucose-Capillary: 102 mg/dL — ABNORMAL HIGH (ref 70–99)
Glucose-Capillary: 90 mg/dL (ref 70–99)
Glucose-Capillary: 188 mg/dL — ABNORMAL HIGH (ref 70–99)

## 2014-06-20 LAB — VANCOMYCIN, TROUGH: Vancomycin Tr: 10.2 ug/mL (ref 10.0–20.0)

## 2014-06-20 MED ORDER — HYDROCODONE-HOMATROPINE 5-1.5 MG/5ML PO SYRP
5.0000 mL | ORAL_SOLUTION | Freq: Four times a day (QID) | ORAL | Status: DC | PRN
  Administered 2014-06-20 – 2014-06-22 (×5): 5 mL via ORAL
  Filled 2014-06-20 (×5): qty 5

## 2014-06-20 MED ORDER — SODIUM CHLORIDE 0.9 % IV BOLUS (SEPSIS)
500.0000 mL | Freq: Once | INTRAVENOUS | Status: AC
  Administered 2014-06-20: 500 mL via INTRAVENOUS

## 2014-06-20 MED ORDER — VANCOMYCIN HCL IN DEXTROSE 1-5 GM/200ML-% IV SOLN
1000.0000 mg | Freq: Three times a day (TID) | INTRAVENOUS | Status: DC
  Administered 2014-06-20 – 2014-06-22 (×7): 1000 mg via INTRAVENOUS
  Filled 2014-06-20 (×8): qty 200

## 2014-06-20 MED ORDER — METFORMIN HCL 500 MG PO TABS
500.0000 mg | ORAL_TABLET | Freq: Two times a day (BID) | ORAL | Status: DC
  Administered 2014-06-20 – 2014-06-22 (×4): 500 mg via ORAL
  Filled 2014-06-20 (×6): qty 1

## 2014-06-20 MED ORDER — MORPHINE SULFATE 15 MG PO TABS: 22.5000 mg | ORAL_TABLET | ORAL | Status: DC | PRN

## 2014-06-20 MED ORDER — METOPROLOL TARTRATE 1 MG/ML IV SOLN
5.0000 mg | Freq: Once | INTRAVENOUS | Status: AC
  Administered 2014-06-20: 5 mg via INTRAVENOUS
  Filled 2014-06-20: qty 5

## 2014-06-20 MED ORDER — MORPHINE SULFATE 15 MG PO TABS
15.0000 mg | ORAL_TABLET | ORAL | Status: DC | PRN
Start: 2014-06-20 — End: 2014-06-22
  Administered 2014-06-20 – 2014-06-21 (×3): 22.5 mg via ORAL
  Administered 2014-06-21 (×2): 15 mg via ORAL
  Administered 2014-06-21 – 2014-06-22 (×2): 22.5 mg via ORAL
  Filled 2014-06-20: qty 1
  Filled 2014-06-20 (×2): qty 2
  Filled 2014-06-20: qty 1
  Filled 2014-06-20 (×3): qty 2

## 2014-06-20 MED ORDER — MORPHINE SULFATE ER 15 MG PO TBCR
15.0000 mg | EXTENDED_RELEASE_TABLET | Freq: Two times a day (BID) | ORAL | Status: DC
  Administered 2014-06-20 – 2014-06-21 (×3): 15 mg via ORAL
  Filled 2014-06-20 (×4): qty 1

## 2014-06-20 MED ORDER — GLIMEPIRIDE 2 MG PO TABS
2.0000 mg | ORAL_TABLET | Freq: Every day | ORAL | Status: DC
  Administered 2014-06-21 – 2014-06-22 (×2): 2 mg via ORAL
  Filled 2014-06-20 (×3): qty 1

## 2014-06-20 NOTE — Progress Notes (Signed)
06-18-14--ED bedside I&D of large dorsal abscess of left thumb GM stain = GPC in pairs----[facility]  Wound: 1. Begin daily wound care with [facility] nurse consult for recommendations, would benefit from hand whirlpool if available 2. I will follow for wound management and functional restoration  Infection management per primary team (presently on Zosyn and Vanc and cx pending).  Given the magnitude of this infection and his host status, I think he would benefit from Infectious Disease consult and outpatient follow-up for resolution of this infection.

## 2014-06-20 NOTE — Progress Notes (Signed)
Nutrition Brief Note  Patient identified on the Malnutrition Screening Tool (MST) Report  Pt with weight gain since October. Per RN, pt is eating well, if not eating the meal trays provided here, his family will bring him food for him.  Wt Readings from Last 15 Encounters:  06/19/14 164 lb 6.4 oz (74.571 kg)  05/25/14 167 lb 12.8 oz (76.114 kg)  05/21/14 163 lb 12.8 oz (74.299 kg)  05/14/14 139 lb 12.8 oz (63.413 kg)  05/09/14 156 lb 11.2 oz (71.079 kg)    Body mass index is 27.36 kg/(m^2). Patient meets criteria for overweight based on current BMI.   Current diet order is regular, patient is consuming approximately 75-100% of meals at this time. Labs and medications reviewed.   No nutrition interventions warranted at this time. If nutrition issues arise, please consult RD.   Clayton Bibles, MS, RD, LDN Pager: [phone] After Hours Pager: [phone]

## 2014-06-20 NOTE — Consult Note (Signed)
WOC wound consult note Reason for Consult:Infected thumb of left hand, I&D'd in ED.  Seem by ortho earlier today.  I am asked to recommend and implement topical wound therapy. Patient is in a great deal of pain and has been premedicated prior to my visit. Wound type:Infectious, surgical (s/p I&D) Pressure Ulcer POA: Yes/No Measurement: Two surgical incisions, one measuring 4.5cm and a second measuring 3cm were made, these reveal wound of that length with 1.5cm width and 0.2cm depth. Wound bed:red, moist Drainage (amount, consistency, odor) yellow exudate continued in a moderate amount Periwound:macerated Dressing procedure/placement/frequency: I will implement a conservative plan of care to address need for moisture retentive wound healing, but also maceration from being too moist by way of xeroform gauze as a wound contact layer. This will be covered with dry gauze dressings and secured with Kerlix and tape.  Twice daily changes are recommended.  Elevation above the heart for edema and pain management are suggested.  [facility] nursing team will not follow, but will remain available to this patient, the nursing and medical team.  Please re-consult if needed. Thanks, Maudie Flakes, MSN, RN, [place], [place], [place] [phone])

## 2014-06-20 NOTE — Progress Notes (Addendum)
Inpatient RN visit-[facility] 5 E Room 1520[facility] RN Visit-Karen Alford [place] RN. This is a NON Related, non covered admission to [facility] diagnosis of Lung Cancer. Patient is Full Code. Patient sitting up in bed alert.  Joint visit made with [facility] LCSW [place]. Language line used to discuss code status and inquire about any specific questions or needs. Patient confirmed that he wants to remain a full code. Staff RN Raquel Sarna advised. Attending physician Dr. Wynelle Cleveland also present for part of the visit and addressed  8/10 pain and persistent cough patient was reporting. Patient was currently recvg MSIR 15mg  Q 4hrs PRN, had just recvd a dose prior to visit as well as a dose of IV dilaudid 1mg . Dr. Wynelle Cleveland gave new orders for [facility] 15mg -22.5 mg Q 4 hrs PRN and Hycodan 55ml Q 6 hrs for cough. Staff RN Raquel Sarna administered cough medication during visit. Patient had been taking the cough medication at home prior to admission. Patient remains on IV abt for treatment of infection to his L thumb. Cultures pending.  Dressing intact to L hand,. some drainage noted to  dressing over the top of the thumb. Arm elevated on three pillows. Patient reports his daughter is coming this afternoon. [facility] will continue to follow. Patient's home medication list, and transfer summary in place on shadow chart.  Please call [facility] @ [phone]-with any hospice needs.  Thank you. Tracey Harries, RN [facility] Liaison [phone])

## 2014-06-20 NOTE — Progress Notes (Addendum)
[facility] Progress note  Franchot Pollitt JME:[id number] DOB: Dec 09, 1965 DOA: 06/18/2014  Referring physician:   PCP: No PCP Per Patient   Chief Complaint:  HPI:  48 y.o. 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 Now on home hospice but remains full code. Not sure if they understand prognosis. Admitted for Left thumb abscess-  swelling and redness has  been present for a couple days.  He finished radiation therapy roughly 3 weeks ago. His white count is elevated but without signs/symptoms of systemic illness .Seen by Dr Grandville Silos ,who performed  bedside I&D, admitted for iv abx .  Subjective Spoke with patient via phone interpretor, he complains of 8/10 pain and is requesting Hycodan, which he takes at home, as his cough is severe.  Left thumb abscess, likely extending from paronychium  follow cultures- reveal staph- awaiting sensitivities- cont both Vanc and Zosyn for now Significantly elevated CRP at 18.6 Leukocytosis improving Being followed by ortho surgery-  Dr. Grandville Silos - WOC recommendations noted- BID dressings - pain is significant- pt tachycardic and hypertensive because of it-  add MS contin and increase dose of MSIR - goal is to achieve pain control without the use of IV Dilaudid  Hyperglycemia secondary to steroids  A1c- 7.5 Started on Lantus sliding scale insulin - will transition to oral hypoglycemics - start Metformin and Amaryl in AM   Hyponatremia  Due to SIADH? - Na 129 on admission and now 130 - on NS- will d/c and follow sodium  Elevated alkaline phosphatase Likely secondary to bony metastasis   Code Status:  Full code Family Communication:  Disposition Plan: home with hospice    Prior to Admission medications    Medication Sig Start Date End Date Taking? Authorizing Provider  acetaminophen (TYLENOL) 325 MG tablet Take 2 tablets (650 mg total) by mouth every 6 (six) hours as needed for mild pain (or Fever >/= 101). 05/12/14  Yes Christina P Rama, MD  dexamethasone (DECADRON) 4 MG tablet Take 1 tablet (4 mg total) by mouth 2 (two) times daily. 06/12/14  Yes Heath Lark, MD  Docusate Sodium (DSS) 100 MG CAPS Take 100 mg by mouth 2 (two) times daily. Qty 60  3 refills Called into [place] 05/18/14  Yes Lora Paula, MD  HYDROcodone-homatropine [facility]) 5-1.5 MG/5ML syrup Take 5 mLs by mouth every 6 (six) hours as needed for cough.   Yes Historical Provider, MD  morphine (MSIR) 15 MG tablet Take 7.5-15 mg by mouth every 4 (four) hours as needed for severe pain.   Yes Historical Provider, MD  pantoprazole (PROTONIX) 40 MG tablet Take 1 tablet (40 mg total) by mouth daily. 05/12/14  Yes Venetia Maxon Rama, MD     Physical Exam: Filed Vitals:   06/19/14 1403 06/19/14 2139 06/20/14 0606 06/20/14 1245  BP: 132/84 145/84 132/84 157/103  Pulse: 88 103 113 115  Temp: 98.9 F (37.2 C) 98.8 F (37.1 C) 100.3 F (37.9 C) 99.9 F (37.7 C)  TempSrc: Oral Oral Oral Oral  Resp: 20 20 18 18   Height:      Weight:      SpO2: 97% 98% 95% 93%     Constitutional:  Patient is a well-developed and well-nourished in no acute distress and cooperative with exam. Alert and  oriented x3.  Cardiovascular: RRR, S1 normal, S2 normal, no MRG, pulses symmetric and intact bilaterally  Pulmonary/Chest: CTAB, no wheezes, rales, or rhonchi  Abdominal: Soft. Non-tender, non-distended, bowel sounds are normal, no masses, organomegaly, or guarding present.  Ext: no edema and no cyanosis, pulses palpable bilaterally (DP and PT)  Neurological: A&O x3, Strenght is normal and symmetric bilaterally, cranial nerve II-XII are grossly intact, no focal motor deficit, sensory intact to light touch bilaterally.  Skin:  Warm, dry and intact. No rash, cyanosis, or clubbing. Left thumb in dressing - not examined today Psychiatric: Normal mood and affect. speech and behavior is normal. Judgment and thought content normal. Cognition and memory are normal.       Labs on Admission:    Basic Metabolic Panel:  Recent Labs Lab 06/18/14 1308 06/18/14 1902 06/19/14 0500 06/20/14 0515  NA 129*  --  132* 132*  K 3.6*  --  3.9 3.6*  CL 89*  --  92* 93*  CO2 22  --  27 24  GLUCOSE 335*  --  206* 117*  BUN 9  --  12 11  CREATININE 0.42*  --  0.45* 0.49*  CALCIUM 8.9  --  8.5 8.7  MG  --  2.3  --   --    Liver Function Tests:  Recent Labs Lab 06/19/14 0500 06/20/14 0515  AST 22 42*  ALT 45 46  ALKPHOS 512* 591*  BILITOT 0.5 0.6  PROT 6.8 7.1  ALBUMIN 2.1* 2.0*   No results for input(s): LIPASE, AMYLASE in the last 168 hours. No results for input(s): AMMONIA in the last 168 hours. CBC:  Recent Labs Lab 06/18/14 1308 06/19/14 0500 06/20/14 0515  WBC 15.8* 11.5* 13.4*  NEUTROABS 14.8*  --   --   HGB 12.9* 10.6* 12.2*  HCT 37.9* 31.3* 35.4*  MCV 85.9 86.7 86.6  PLT 92* 84* 95*   Cardiac Enzymes: No results for input(s): CKTOTAL, CKMB, CKMBINDEX, TROPONINI in the last 168 hours.  BNP (last 3 results) No results for input(s): PROBNP in the last 8760 hours.    CBG:  Recent Labs Lab 06/19/14 1125 06/19/14 1636 06/19/14 2138 06/20/14 0741 06/20/14 1135  GLUCAP 248* 160* 281* 102* 90    Radiological Exams on Admission: No results found.  EKG: Independently reviewed.     Time spent: 30 mins   [facility] Pager- [email or web address] Password [facility] 06/20/2014, 2:33 PM

## 2014-06-20 NOTE — Progress Notes (Signed)
Rm [place] visit with RN Craige Cotta MD and unit Rn also present for the visit. Language Line interpreter used. Inquired about any specific needs. Pt reported wanting something for his cough. Medication was ordered and given. Pt's oldest teen dtr will be visiting this afternoon and will bring food. Clarified pt's code status. At this time, pt desires to remain a full code. Explained that he may be discharged in a day or two and hospice will continue to follow. Benedict Needy [place],LCSW

## 2014-06-20 NOTE — Progress Notes (Signed)
ANTIBIOTIC CONSULT NOTE - follow up  Pharmacy Consult for vancomycin and Zosyn Indication: cellulitis L thumb with associated abscess,s/p I&D 11/30  No Known Allergies  Patient Measurements: Weight= 74.6kg on 06/19/14   Vital Signs: Temp: 100.3 F (37.9 C) (12/02 0606) Temp Source: Oral (12/02 0606) BP: 132/84 mmHg (12/02 0606) Pulse Rate: 113 (12/02 0606)  Labs:  Recent Labs  06/18/14 1308 06/19/14 0500 06/20/14 0515  WBC 15.8* 11.5* 13.4*  HGB 12.9* 10.6* 12.2*  PLT 92* 84* 95*  CREATININE 0.42* 0.45* 0.49*    Medical History: Past Medical History  Diagnosis Date  . 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 Primary lung cancer with metastasis from lung to other site 05/11/2014  . Reflux     Medications:  Scheduled:  . docusate sodium  100 mg Oral BID  . heparin  5,000 Units Subcutaneous 3 times per day  . insulin aspart  0-15 Units Subcutaneous TID WC  . insulin glargine  20 Units Subcutaneous Daily  . lidocaine  10 mL Intradermal Once  . piperacillin-tazobactam (ZOSYN)  IV  3.375 g Intravenous Q8H  . sodium chloride  3 mL Intravenous Q12H  . vancomycin  1,000 mg Intravenous Q8H   Infusions:  . sodium chloride 75 mL/hr at 06/18/14 1950   Assessment: 29 yoM admitted 11/30 with L thumb cellulitis. Pt is currently under the care of hospice for lung cancer and reported removing a bump on his L thumb nail 2 days PTA. The area was edematous with purulent drainage. L hand Xray showed soft tissue swelling without bony abnormality. I&D was performed 11/30 and culture sent. Noted patient with recent admission 10/17-10/24 for neck swelling, with some concerns for latent TB. Pharmacy was consulted to dose vancomycin and Zosyn for cellulitis with associated abscess.  Goal of Therapy:  Appropriate antibiotic dosing for indication and renal function; eradication of infection. Vancomycin trough 10-15   Antiinfectives  11/30 >> vancomycin >> 11/30 >> Zosyn >>     . Microbiology 11/30 L thumb wound: Gram stain showed gram-positive cocci in pairs; culture still pending  Today, 12/2: D#2 vancomycin 1 gram IV q8h / Zosyn 3.375 grams IV q8h (extended-infusion) Tmax 100.3 WBC 13.4 (up slightly from yesterday) Culture still pending SCr stable Vancomycin trough 10.2 - acceptable for soft tissue infection with no bony involvement, s/p I&D   Plan:  1.  Continue vancomycin 1g IV q8h 2.  Continue Zosyn 3.375G IV q8h, each dose to be infused over 4 hours 3.  Follow-up on culture result. 4.  Follow serum creatinine, clinical course.  Clayburn Pert, PharmD, BCPS Pager: [phone] 06/20/2014  7:31 AM

## 2014-06-20 NOTE — Progress Notes (Signed)
RN paged NP secondary to pt's HR being in the 160-170 range. Sinus Tachycardia. T 101.6 (not new). Pt here with known infection of cellulitis of thumb on Vanc and Zosyn. Culture done and are awaiting sensitivities. 12 lead EKG showed ST at 154. Gave 500cc NS bolus and Metoprolol 5mg . Otherwise, VS are stable and pt in no acute distress. Follow HR. Clance Boll, NP [facility]

## 2014-06-21 ENCOUNTER — Encounter (HOSPITAL_COMMUNITY): Payer: Self-pay | Admitting: Radiology

## 2014-06-21 ENCOUNTER — Inpatient Hospital Stay (HOSPITAL_COMMUNITY): Payer: Medicaid Other

## 2014-06-21 DIAGNOSIS — I2699 Other pulmonary embolism without acute cor pulmonale: Secondary | ICD-10-CM

## 2014-06-21 DIAGNOSIS — R000 Tachycardia, unspecified: Secondary | ICD-10-CM | POA: Insufficient documentation

## 2014-06-21 DIAGNOSIS — I771 Stricture of artery: Secondary | ICD-10-CM

## 2014-06-21 DIAGNOSIS — C7989 Secondary malignant neoplasm of other specified sites: Secondary | ICD-10-CM

## 2014-06-21 LAB — GLUCOSE, CAPILLARY
GLUCOSE-CAPILLARY: 87 mg/dL (ref 70–99)
Glucose-Capillary: 215 mg/dL — ABNORMAL HIGH (ref 70–99)
Glucose-Capillary: 115 mg/dL — ABNORMAL HIGH (ref 70–99)
Glucose-Capillary: 149 mg/dL — ABNORMAL HIGH (ref 70–99)

## 2014-06-21 LAB — WOUND CULTURE: GRAM STAIN: NONE SEEN

## 2014-06-21 LAB — TSH: TSH: 1.58 u[IU]/mL (ref 0.350–4.500)

## 2014-06-21 LAB — CBC
HCT: 35.9 % — ABNORMAL LOW (ref 39.0–52.0)
Hemoglobin: 12.3 g/dL — ABNORMAL LOW (ref 13.0–17.0)
MCH: 29.4 pg (ref 26.0–34.0)
MCHC: 34.3 g/dL (ref 30.0–36.0)
MCV: 85.7 fL (ref 78.0–100.0)
PLATELETS: 111 10*3/uL — AB (ref 150–400)
RBC: 4.19 MIL/uL — ABNORMAL LOW (ref 4.22–5.81)
RDW: 14.4 % (ref 11.5–15.5)
WBC: 13.9 10*3/uL — ABNORMAL HIGH (ref 4.0–10.5)

## 2014-06-21 LAB — BASIC METABOLIC PANEL
ANION GAP: 17 — AB (ref 5–15)
BUN: 5 mg/dL — ABNORMAL LOW (ref 6–23)
CALCIUM: 8.4 mg/dL (ref 8.4–10.5)
CO2: 25 meq/L (ref 19–32)
Chloride: 93 mEq/L — ABNORMAL LOW (ref 96–112)
Creatinine, Ser: 0.51 mg/dL (ref 0.50–1.35)
GFR calc Af Amer: >90 mL/min (ref >90)
Glucose, Bld: 161 mg/dL — ABNORMAL HIGH (ref 70–99)
POTASSIUM: 3.1 meq/L — AB (ref 3.7–5.3)
SODIUM: 135 meq/L — AB (ref 137–147)

## 2014-06-21 LAB — CREATININE, SERUM: Creatinine, Ser: 0.51 mg/dL (ref 0.50–1.35)

## 2014-06-21 LAB — T4, FREE: Free T4: 1.41 ng/dL (ref 0.80–1.80)

## 2014-06-21 LAB — MAGNESIUM: MAGNESIUM: 1.7 mg/dL (ref 1.5–2.5)

## 2014-06-21 MED ORDER — POTASSIUM CHLORIDE CRYS ER 20 MEQ PO TBCR
40.0000 meq | EXTENDED_RELEASE_TABLET | ORAL | Status: AC
  Administered 2014-06-21 (×2): 40 meq via ORAL
  Filled 2014-06-21 (×2): qty 2

## 2014-06-21 MED ORDER — METOPROLOL TARTRATE 12.5 MG HALF TABLET
12.5000 mg | ORAL_TABLET | Freq: Two times a day (BID) | ORAL | Status: DC
  Administered 2014-06-21 – 2014-06-22 (×3): 12.5 mg via ORAL
  Filled 2014-06-21 (×4): qty 1

## 2014-06-21 MED ORDER — IOHEXOL 350 MG/ML SOLN
80.0000 mL | Freq: Once | INTRAVENOUS | Status: AC | PRN
  Administered 2014-06-21: 80 mL via INTRAVENOUS

## 2014-06-21 MED ORDER — SODIUM CHLORIDE 0.9 % IV BOLUS (SEPSIS)
500.0000 mL | Freq: Once | INTRAVENOUS | Status: AC
  Administered 2014-06-21: 500 mL via INTRAVENOUS

## 2014-06-21 NOTE — Progress Notes (Signed)
Pt HR sustaining between 150s & 170s. Pt has no c/o chest pain or shortness of breath. Pain rated 5/10 before pain med administered.  Notified MD. New orders placed.

## 2014-06-21 NOTE — Progress Notes (Signed)
[facility] Progress note  Russell Stanley ATF:[id number] DOB: 1965/12/14 DOA: 06/18/2014   PCP: No PCP Per Patient   Chief Complaint:  HPI:  48 y.o. 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 Now on home hospice but remains full code. Not sure if they understand prognosis. Admitted for Left thumb abscess-  swelling and redness has  been present for a couple days.  He finished radiation therapy roughly 3 weeks ago. His white count is elevated but without signs/symptoms of systemic illness .Seen by Dr Grandville Silos ,who performed  bedside I&D, admitted for iv abx .  Subjective: Left thumb abscess, likely extending from paronychium -cultures reveal MRSA- appreciate ID recommendations- plan for Oritavancin as outpt once discharged followed by Bactrim DS 1 tab BID for 10 days. Significantly elevated CRP at 18.6 Leukocytosis improving Being followed by ortho surgery-  Dr. Grandville Silos - WOC recommendations noted- BID dressings  Tachycardia - found to have extensively advanced tumor in chest with compression of pulmonary arteries and extension into the pericardium along with a loculated pleural effusion and progression of "innumerable" nodular metastasis in both lungs along with new cavitary metastasis. -Small pulmonary emboli also noted -At this point prognosis is extremely grave-would not begin anticoagulation for PE as mass has almost extended into the heart pulm vasculature and hemorrhage will result. -Have discussed these findings in detail with patient in presence of his daughter- He appears to understand- have discussed code status and he would like to change it to DNR  Hyperglycemia secondary to steroids  - A1c- 7.5 - Started on Lantus sliding scale insulin -  transitioned to Metformin and Amaryl today sugars controlled  Hyponatremia  Due to SIADH? - Na 129 on admission and now 135 - stopped NS  Hypokalemia - cont to replace  Elevated alkaline phosphatase Likely secondary to bony metastasis   Code Status:  Full code Family Communication:  Disposition Plan: home with hospice    Prior to Admission medications   Medication Sig Start Date End Date Taking? Authorizing Provider  acetaminophen (TYLENOL) 325 MG tablet Take 2 tablets (650 mg total) by mouth every 6 (six) hours as needed for mild pain (or Fever >/= 101). 05/12/14  Yes Christina P Rama, MD  dexamethasone (DECADRON) 4 MG tablet Take 1 tablet (4 mg total) by mouth 2 (two) times daily. 06/12/14  Yes Heath Lark, MD  Docusate Sodium (DSS) 100 MG CAPS Take 100 mg by mouth 2 (two) times daily. Qty 60  3 refills Called into [place] 05/18/14  Yes Lora Paula, MD  HYDROcodone-homatropine [facility]) 5-1.5 MG/5ML syrup Take 5 mLs by mouth every 6 (six) hours as needed for cough.   Yes Historical Provider, MD  morphine (MSIR) 15 MG tablet Take 7.5-15 mg by mouth every 4 (four) hours as needed for severe pain.   Yes Historical Provider, MD  pantoprazole (PROTONIX) 40 MG tablet Take 1 tablet (40 mg total) by mouth daily. 05/12/14  Yes Venetia Maxon Rama, MD     Physical Exam: Filed Vitals:   06/20/14 1948 06/20/14 2131 06/21/14 0444 06/21/14 1500  BP: 156/97 147/92 143/88 127/81  Pulse: 170 136 147 144  Temp: 101.6 F (38.7 C) 100 F (37.8 C) 99.7 F (37.6 C) 98.6 F (37 C)  TempSrc:  Oral Oral  Oral  Resp: 22 26 22 20   Height:      Weight:      SpO2:  93% 90% 90%     Constitutional:  Patient is a well-developed and well-nourished in no acute distress and cooperative with exam. Alert and oriented x3.  Cardiovascular: RRR, S1 normal, S2 normal, no MRG, pulses symmetric and intact bilaterally  Pulmonary/Chest: CTAB, no wheezes, rales, or rhonchi  Abdominal:  Soft. Non-tender, non-distended, bowel sounds are normal, no masses, organomegaly, or guarding present.  Ext: no edema and no cyanosis, pulses palpable bilaterally (DP and PT)  Neurological: A&O x3, Strenght is normal and symmetric bilaterally, cranial nerve II-XII are grossly intact, no focal motor deficit, sensory intact to light touch bilaterally.  Skin: Warm, dry and intact. No rash, cyanosis, or clubbing. Left thumb in dressing - not examined today Psychiatric: Normal mood and affect. speech and behavior is normal. Judgment and thought content normal. Cognition and memory are normal.       Labs on Admission:    Basic Metabolic Panel:  Recent Labs Lab 06/18/14 1308 06/18/14 1902 06/19/14 0500 06/20/14 0515 06/21/14 0427 06/21/14 1330  NA 129*  --  132* 132* 135*  --   K 3.6*  --  3.9 3.6* 3.1*  --   CL 89*  --  92* 93* 93*  --   CO2 22  --  27 24 25   --   GLUCOSE 335*  --  206* 117* 161*  --   BUN 9  --  12 11 5*  --   CREATININE 0.42*  --  0.45* 0.49* 0.51  0.51  --   CALCIUM 8.9  --  8.5 8.7 8.4  --   MG  --  2.3  --   --   --  1.7   Liver Function Tests:  Recent Labs Lab 06/19/14 0500 06/20/14 0515  AST 22 42*  ALT 45 46  ALKPHOS 512* 591*  BILITOT 0.5 0.6  PROT 6.8 7.1  ALBUMIN 2.1* 2.0*   No results for input(s): LIPASE, AMYLASE in the last 168 hours. No results for input(s): AMMONIA in the last 168 hours. CBC:  Recent Labs Lab 06/18/14 1308 06/19/14 0500 06/20/14 0515 06/21/14 0427  WBC 15.8* 11.5* 13.4* 13.9*  NEUTROABS 14.8*  --   --   --   HGB 12.9* 10.6* 12.2* 12.3*  HCT 37.9* 31.3* 35.4* 35.9*  MCV 85.9 86.7 86.6 85.7  PLT 92* 84* 95* 111*   Cardiac Enzymes: No results for input(s): CKTOTAL, CKMB, CKMBINDEX, TROPONINI in the last 168 hours.  BNP (last 3 results) No results for input(s): PROBNP in the last 8760 hours.    CBG:  Recent Labs Lab 06/20/14 1635 06/20/14 2130 06/21/14 0749 06/21/14 1145 06/21/14 1637  GLUCAP  188* 238* 215* 87 115*    Radiological Exams on Admission: Ct Angio Chest Pe W/cm &/or Wo Cm  06/21/2014   CLINICAL DATA:  Tachycardia. Cough. Recent diagnosis of lung cancer. Elevated white blood count.  EXAM: CT ANGIOGRAPHY CHEST WITH CONTRAST  TECHNIQUE: Multidetector CT imaging of the chest was performed using the standard protocol during bolus administration of intravenous contrast. Multiplanar CT image reconstructions and MIPs were obtained to evaluate the vascular anatomy.  CONTRAST:  100mL OMNIPAQUE IOHEXOL 350 MG/ML SOLN  COMPARISON:  CT scan dated 05/09/2014  FINDINGS: There are several small emboli in the left lower lobe and 1 tiny embolus in the right lower lobe. There has been marked progression of the  tumor in the left upper lobe with increased extension into the left hilum and along the left side of the pericardium. There is now a new pericardial effusion which is probably a malignant effusion. There is now compression of the left main pulmonary artery as well as more peripheral branches. There is obstruction of 1 of the left upper lobe pulmonary arteries as it originates from the left main pulmonary artery.  There is also a new low loculated left pleural effusion posteriorly and laterally. The extensive nodular metastases have markedly increased throughout both lungs. There is no lymphangitic tumor at the left lung base. There are cavitary lesions in the lingula and left lower lobe which are new.  The osseous metastases have all progressed particularly in the thoracic spine. Pathologic fracture of superior endplate of Y07 is more pronounced. No tumor extension and the to the spinal canal at this time. The visualized portion of the upper abdomen is normal.  Review of the MIP images confirms the above findings.  IMPRESSION: 1. Small pulmonary emboli in both lower lobes. 2. Marked progression of tumor in the left hemithorax with extension into the pericardium and marked progression in the left hilum  with compression of the pulmonary arteries. 3. New loculated left pleural effusion. 4. Marked progression of innumerable nodular metastases in both lungs with new cavitary metastases in the lingula and left lower lobe. 5. Marked progression of diffuse osseous metastases in the thoracic spine.   Electronically Signed   By: Rozetta Nunnery M.D.   On: 06/21/2014 15:33    EKG: Independently reviewed.     Time spent: 30 mins   [facility] Pager- [email or web address] Password [facility] 06/21/2014, 5:23 PM

## 2014-06-21 NOTE — Consult Note (Addendum)
[facility]  Total days of antibiotics 4        Day 4 vanco        Day 4 piptazo               Reason for Consult: mrsa abscess    Referring Physician: rizwan  Principal Problem:   Cellulitis and abscess Active Problems:   Primary lung cancer with metastasis from lung to other site   Hyponatremia   Abscess of thumb    HPI: Russell Stanley is a 48 y.o. male from [place], who speaks kayah, with recently diagnosed metastatic lung cancer (dx in mid oct 2015) last finished radiation 3 weeks ago. He is on hospice care. He presents to the ED on 11/30 with left thumb infection that has evolved over a few days where his thumb has been increasingly swollen, darker in coloration, blistering, and painful. He finished radiation therapy roughly 3 weeks ago. On admit, his white count was elevated at 15.8K with left shift but without signs/symptoms of systemic illness. He underwent bedside debridement by Dr. Grandville Silos, his note reported: Thick pus was evident in the subcutaneous space and cultures were obtained. Spreading dissection was carried through subcutaneous space on the dorsum of the thumb just proximal to the level of the MP joint with a process ended, and at this point another longitudinal incision was made proximally as well as 1 more small incision radially. Through these 3 incisions, the subcutaneous space was copiously irrigated and the epidermis over the blistered area was excisionally debrided as well. Iodoform packing was placed.   He was started on empiric vancomycin and piptazo in addition to management of wound care team. ID consulted for further antibiotics recommendations. Today was found to be tachycardic, and underwent chest ct to evaluate for PE. The scan showed new small pulmonary emboli in both lower lobes, however theres is marked progression of tumor in the left hemithorax with extension into the pericardium and marked progression in the left hilum with  compression of the pulmonary arteries.New loculated left pleural effusion.Marked progression of innumerable nodular metastases in both lungs with new cavitary metastases in the lingula and left lowerlobe. Marked progression of diffuse osseous metastases in the thoracic spine  Past Medical History  Diagnosis Date  . 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 Primary lung cancer with metastasis from lung to other site 05/11/2014  . Reflux     Allergies: No Known Allergies  MEDICATIONS: . docusate sodium  100 mg Oral BID  . glimepiride  2 mg Oral Q breakfast  . heparin  5,000 Units Subcutaneous 3 times per day  . insulin aspart  0-15 Units Subcutaneous TID WC  . lidocaine  10 mL Intradermal Once  . metFORMIN  500 mg Oral BID WC  . metoprolol tartrate  12.5 mg Oral BID  . morphine  15 mg Oral Q12H  . piperacillin-tazobactam (ZOSYN)  IV  3.375 g Intravenous Q8H  . potassium chloride  40 mEq Oral Q4H  . sodium chloride  500 mL Intravenous Once  . sodium chloride  3 mL Intravenous Q12H  . vancomycin  1,000 mg Intravenous Q8H    History  Substance Use Topics  . Smoking status: Never Smoker   . Smokeless tobacco: Current User    Types: Chew  . Alcohol Use: No    History reviewed. No pertinent family history.  Review of Systems  Unable to obtain due to not having access to interpreter on phone  OBJECTIVE: Temp:  [98.5 F (36.9 C)-101.6 F (38.7 C)] 99.7 F (37.6 C) (12/03 0444) Pulse Rate:  [136-170] 147 (12/03 0444) Resp:  [20-26] 22 (12/03 0444) BP: (143-165)/(88-97) 143/88 mmHg (12/03 0444) SpO2:  [90 %-94 %] 90 % (12/03 0444) Physical Exam  Constitutional: He is oriented to person, when i say his name He appears well-developed and well-nourished. No distress.  HENT: round facies from steroids Mouth/Throat: Oropharynx is clear and moist. No oropharyngeal exudate.  Cardiovascular: tachy regular rhythm and normal heart sounds. Exam reveals no gallop and no friction rub.  No murmur  heard.  Pulmonary/Chest: decrease BS sounds at bases. No respiratory distress. He has no wheezes. occ dry cough during exam  Abdominal: Soft. Bowel sounds are normal. He exhibits no distension. There is no tenderness.  Lymphadenopathy:  He has no cervical adenopathy.  Skin: left hand wrapped from I xD Ext: trace edema on upper extremities Psychiatric: He has a normal mood and affect. His behavior is normal.    LABS: Results for orders placed or performed during the hospital encounter of 06/18/14 (from the past 48 hour(s))  Glucose, capillary     Status: Abnormal   Collection Time: 06/19/14  4:36 PM  Result Value Ref Range   Glucose-Capillary 160 (H) 70 - 99 mg/dL   Comment 1 Notify RN   Glucose, capillary     Status: Abnormal   Collection Time: 06/19/14  9:38 PM  Result Value Ref Range   Glucose-Capillary 281 (H) 70 - 99 mg/dL  Comprehensive metabolic panel     Status: Abnormal   Collection Time: 06/20/14  5:15 AM  Result Value Ref Range   Sodium 132 (L) 137 - 147 mEq/L   Potassium 3.6 (L) 3.7 - 5.3 mEq/L   Chloride 93 (L) 96 - 112 mEq/L   CO2 24 19 - 32 mEq/L   Glucose, Bld 117 (H) 70 - 99 mg/dL   BUN 11 6 - 23 mg/dL   Creatinine, Ser 0.49 (L) 0.50 - 1.35 mg/dL   Calcium 8.7 8.4 - 10.5 mg/dL   Total Protein 7.1 6.0 - 8.3 g/dL   Albumin 2.0 (L) 3.5 - 5.2 g/dL   AST 42 (H) 0 - 37 U/L   ALT 46 0 - 53 U/L   Alkaline Phosphatase 591 (H) 39 - 117 U/L   Total Bilirubin 0.6 0.3 - 1.2 mg/dL   GFR calc non Af Amer >90 >90 mL/min   GFR calc Af Amer >90 >90 mL/min    Comment: (NOTE) The eGFR has been calculated using the CKD EPI equation. This calculation has not been validated in all clinical situations. eGFR's persistently <90 mL/min signify possible Chronic Kidney Disease.    Anion gap 15 5 - 15  CBC     Status: Abnormal   Collection Time: 06/20/14  5:15 AM  Result Value Ref Range   WBC 13.4 (H) 4.0 - 10.5 K/uL   RBC 4.09 (L) 4.22 - 5.81 MIL/uL   Hemoglobin 12.2 (L) 13.0 -  17.0 g/dL   HCT 35.4 (L) 39.0 - 52.0 %   MCV 86.6 78.0 - 100.0 fL   MCH 29.8 26.0 - 34.0 pg   MCHC 34.5 30.0 - 36.0 g/dL   RDW 14.0 11.5 - 15.5 %   Platelets 95 (L) 150 - 400 K/uL    Comment: CONSISTENT WITH PREVIOUS RESULT  Vancomycin, trough     Status: None   Collection Time: 06/20/14  5:15 AM  Result Value Ref Range  Vancomycin Tr 10.2 10.0 - 20.0 ug/mL  Glucose, capillary     Status: Abnormal   Collection Time: 06/20/14  7:41 AM  Result Value Ref Range   Glucose-Capillary 102 (H) 70 - 99 mg/dL  Glucose, capillary     Status: None   Collection Time: 06/20/14 11:35 AM  Result Value Ref Range   Glucose-Capillary 90 70 - 99 mg/dL  Glucose, capillary     Status: Abnormal   Collection Time: 06/20/14  4:35 PM  Result Value Ref Range   Glucose-Capillary 188 (H) 70 - 99 mg/dL  Glucose, capillary     Status: Abnormal   Collection Time: 06/20/14  9:30 PM  Result Value Ref Range   Glucose-Capillary 238 (H) 70 - 99 mg/dL  Creatinine, serum     Status: None   Collection Time: 06/21/14  4:27 AM  Result Value Ref Range   Creatinine, Ser 0.51 0.50 - 1.35 mg/dL   GFR calc non Af Amer >90 >90 mL/min   GFR calc Af Amer >90 >90 mL/min    Comment: (NOTE) The eGFR has been calculated using the CKD EPI equation. This calculation has not been validated in all clinical situations. eGFR's persistently <90 mL/min signify possible Chronic Kidney Disease.   CBC     Status: Abnormal   Collection Time: 06/21/14  4:27 AM  Result Value Ref Range   WBC 13.9 (H) 4.0 - 10.5 K/uL   RBC 4.19 (L) 4.22 - 5.81 MIL/uL   Hemoglobin 12.3 (L) 13.0 - 17.0 g/dL   HCT 35.9 (L) 39.0 - 52.0 %   MCV 85.7 78.0 - 100.0 fL   MCH 29.4 26.0 - 34.0 pg   MCHC 34.3 30.0 - 36.0 g/dL   RDW 14.4 11.5 - 15.5 %   Platelets 111 (L) 150 - 400 K/uL    Comment: CONSISTENT WITH PREVIOUS RESULT  Basic metabolic panel     Status: Abnormal   Collection Time: 06/21/14  4:27 AM  Result Value Ref Range   Sodium 135 (L) 137 - 147  mEq/L   Potassium 3.1 (L) 3.7 - 5.3 mEq/L   Chloride 93 (L) 96 - 112 mEq/L   CO2 25 19 - 32 mEq/L   Glucose, Bld 161 (H) 70 - 99 mg/dL   BUN 5 (L) 6 - 23 mg/dL   Creatinine, Ser 0.51 0.50 - 1.35 mg/dL   Calcium 8.4 8.4 - 10.5 mg/dL   GFR calc non Af Amer >90 >90 mL/min   GFR calc Af Amer >90 >90 mL/min    Comment: (NOTE) The eGFR has been calculated using the CKD EPI equation. This calculation has not been validated in all clinical situations. eGFR's persistently <90 mL/min signify possible Chronic Kidney Disease.    Anion gap 17 (H) 5 - 15  Glucose, capillary     Status: Abnormal   Collection Time: 06/21/14  7:49 AM  Result Value Ref Range   Glucose-Capillary 215 (H) 70 - 99 mg/dL  Glucose, capillary     Status: None   Collection Time: 06/21/14 11:45 AM  Result Value Ref Range   Glucose-Capillary 87 70 - 99 mg/dL    MICRO: 11/30 right hand wound: mrsa IMAGING: 11/30 xray of left hand: Diffuse soft tissue swelling of the thumb. No evidence for a fracture or dislocation. No evidence for cortical destruction or periosteal reaction. Alignment of the left hand and is normal.  There are several small emboli in the left lower lobe and 1 tiny embolus in the  right lower lobe. There has been marked progression of the tumor in the left upper lobe with increased extension into the left hilum and along the left side of the pericardium. There is now a new pericardial effusion which is probably a malignant effusion. There is now compression of the left main pulmonary artery as well as more peripheral branches. There is obstruction of 1 of the left upper lobe pulmonary arteries as it originates from the left main pulmonary artery.  There is also a new low loculated left pleural effusion posteriorly and laterally. The extensive nodular metastases have markedly increased throughout both lungs. There is no lymphangitic tumor at the left lung base. There are cavitary lesions in the  lingula and left lower lobe which are new.  The osseous metastases have all progressed particularly in the thoracic spine. Pathologic fracture of superior endplate of T62 is more pronounced. No tumor extension and the to the spinal canal at this time. The visualized portion of the upper abdomen is normal.  Review of the MIP images confirms the above findings.  IMPRESSION: 1. Small pulmonary emboli in both lower lobes. 2. Marked progression of tumor in the left hemithorax with extension into the pericardium and marked progression in the left hilum with compression of the pulmonary arteries. 3. New loculated left pleural effusion. 4. Marked progression of innumerable nodular metastases in both lungs with new cavitary metastases in the lingula and left lower lobe. 5. Marked progression of diffuse osseous metastases in the thoracic spine.  Assessment/Plan:  48yo M with metastatic lung cancer, non english speaking admitted for left thumb MRSA tissue infection s/p I x D, found to have progression of his cancer to thorax,pericardium.compression to pulmonary arteries, and new PE.  - continue on vancomycin for now, goal of 10-15 trough - can likely receive a dose of oritavancin  upon discharge trhough the [facility] cell clinic. I have spoken with case management and pt is approved for the medication. This long acting drug will preclude [place] from needing picc line for IV antibiotics to be given at home - then will ask patient to start taking bactrim DS 1 tab BID roughly 10 days after he gets oritavancin infusion x 10 days - results of ct scan shows advancement of disease process, concern of invasion into pulmonary vascularture and risk of high risk of bleeding, and poor prognosis. Agree to continue with hospice care. - new pulmonary emboli = likely related to advanced malignancy. Defer to primary team for management  Dr. Lucianne Lei dam to help coordinate treatment for MRSA abscess tomorrow   Caren Griffins  [facility] [phone]

## 2014-06-21 NOTE — Progress Notes (Signed)
Inpatient RN visit-Corry Soliday [facility] 5 E Room 1520[facility] RN Visit-Karen Alford Highland RN. This is a NON Related, non covered admission to [facility] diagnosis of Lung Cancer. Patient is Full Code. Patient sitting up in bed alert. Language line used for interpreter.  Writer spoke with staff RN Kiristin regarding patient's continued increased heart rate and increased pain. Per Krisitin and chart review patient has been requiring frequent PRN  pain medication in addition to his scheduled MS Contin. Attending physician has ordered a chest CT as well as a 2 D echo. Writer explained procedures with assistance of interpreter, patient voiced understanding. Patient also reported continued pain in his thumb, denied any chest pain or pain anywhere else. He reported his cough was better with the hycodan started yesterday. No cough noted during visit. Dressing to L hand intact, wound positive for MRSA, patient remains on IV ABT for treatment of infection to L thumb. [facility] will continue to follow. Patient's home medication list and transfer summary in place on shadow chart. Please call [facility] @ [phone]  with any hospice needs.  Thank you. Tracey Harries, RN [facility] Liaison [phone])

## 2014-06-21 NOTE — Progress Notes (Signed)
CRITICAL VALUE ALERT  Critical value received:  + MRSA left thumb wound  Date of notification:  06-21-14  Time of notification:  8:52am  Critical value read back: yes  Nurse who received alert:  Henrietta Dine, RN  MD notified (1st page):  Dr. Wynelle Cleveland  Time of first page:  8:56am  MD notified (2nd page):  Time of second page:  Responding MD:  Dr. Wynelle Cleveland  Time MD responded:  8:56

## 2014-06-22 DIAGNOSIS — C799 Secondary malignant neoplasm of unspecified site: Secondary | ICD-10-CM

## 2014-06-22 DIAGNOSIS — L02512 Cutaneous abscess of left hand: Secondary | ICD-10-CM

## 2014-06-22 DIAGNOSIS — C7931 Secondary malignant neoplasm of brain: Secondary | ICD-10-CM

## 2014-06-22 DIAGNOSIS — B9562 Methicillin resistant Staphylococcus aureus infection as the cause of diseases classified elsewhere: Secondary | ICD-10-CM

## 2014-06-22 DIAGNOSIS — E871 Hypo-osmolality and hyponatremia: Secondary | ICD-10-CM

## 2014-06-22 DIAGNOSIS — L02519 Cutaneous abscess of unspecified hand: Secondary | ICD-10-CM

## 2014-06-22 DIAGNOSIS — C3490 Malignant neoplasm of unspecified part of unspecified bronchus or lung: Secondary | ICD-10-CM

## 2014-06-22 DIAGNOSIS — R000 Tachycardia, unspecified: Secondary | ICD-10-CM

## 2014-06-22 LAB — BASIC METABOLIC PANEL
Anion gap: 15 (ref 5–15)
BUN: 9 mg/dL (ref 6–23)
CO2: 24 meq/L (ref 19–32)
CREATININE: 0.92 mg/dL (ref 0.50–1.35)
Calcium: 8.8 mg/dL (ref 8.4–10.5)
Chloride: 91 mEq/L — ABNORMAL LOW (ref 96–112)
GFR calc Af Amer: >90 mL/min (ref >90)
GFR calc non Af Amer: >90 mL/min (ref >90)
Glucose, Bld: 155 mg/dL — ABNORMAL HIGH (ref 70–99)
Potassium: 3.9 mEq/L (ref 3.7–5.3)
Sodium: 130 mEq/L — ABNORMAL LOW (ref 137–147)

## 2014-06-22 LAB — GLUCOSE, CAPILLARY
GLUCOSE-CAPILLARY: 65 mg/dL — AB (ref 70–99)
Glucose-Capillary: 230 mg/dL — ABNORMAL HIGH (ref 70–99)

## 2014-06-22 MED ORDER — SULFAMETHOXAZOLE-TRIMETHOPRIM 400-80 MG PO TABS: 2.0000 | ORAL_TABLET | Freq: Two times a day (BID) | ORAL | Status: DC

## 2014-06-22 MED ORDER — MORPHINE SULFATE ER 30 MG PO TBCR
30.0000 mg | EXTENDED_RELEASE_TABLET | Freq: Two times a day (BID) | ORAL | Status: DC
  Administered 2014-06-22: 30 mg via ORAL

## 2014-06-22 MED ORDER — MORPHINE SULFATE 15 MG PO TABS: 7.5000 mg | ORAL_TABLET | ORAL | Status: AC | PRN

## 2014-06-22 MED ORDER — METFORMIN HCL 500 MG PO TABS: 500.0000 mg | ORAL_TABLET | Freq: Two times a day (BID) | ORAL | Status: AC

## 2014-06-22 MED ORDER — METOPROLOL TARTRATE 12.5 MG HALF TABLET: 12.5000 mg | ORAL_TABLET | Freq: Two times a day (BID) | ORAL | Status: AC

## 2014-06-22 MED ORDER — SULFAMETHOXAZOLE-TRIMETHOPRIM 400-80 MG PO TABS
2.0000 | ORAL_TABLET | Freq: Two times a day (BID) | ORAL | Status: DC
Start: 2014-06-22 — End: 2014-06-22
  Filled 2014-06-22 (×2): qty 2

## 2014-06-22 MED ORDER — MORPHINE SULFATE ER 30 MG PO TBCR: 30.0000 mg | EXTENDED_RELEASE_TABLET | Freq: Two times a day (BID) | ORAL | Status: AC

## 2014-06-22 MED ORDER — SULFAMETHOXAZOLE-TRIMETHOPRIM 400-80 MG PO TABS: 2.0000 | ORAL_TABLET | Freq: Two times a day (BID) | ORAL | Status: AC

## 2014-06-22 NOTE — Progress Notes (Signed)
[facility] for Infectious Disease    Subjective: Pt seated with hand bandaged   Antibiotics:  Anti-infectives    Start     Dose/Rate Route Frequency Ordered Stop   07/01/14 1000  sulfamethoxazole-trimethoprim (BACTRIM,SEPTRA) 400-80 MG per tablet 2 tablet     2 tablet Oral Every 12 hours 06/22/14 1033     07/01/14 0000  sulfamethoxazole-trimethoprim (BACTRIM,SEPTRA) 400-80 MG per tablet     2 tablet Oral Every 12 hours 06/22/14 1501     06/22/14 1100  sulfamethoxazole-trimethoprim (BACTRIM,SEPTRA) 400-80 MG per tablet 2 tablet  Status:  Discontinued     2 tablet Oral Every 12 hours 06/22/14 1000 06/22/14 1033   06/20/14 0800  vancomycin (VANCOCIN) IVPB 1000 mg/200 mL premix     1,000 mg200 mL/hr over 60 Minutes Intravenous Every 8 hours 06/20/14 0732     06/19/14 0200  piperacillin-tazobactam (ZOSYN) IVPB 3.375 g  Status:  Discontinued     3.375 g12.5 mL/hr over 240 Minutes Intravenous Every 8 hours 06/18/14 1804 06/21/14 1341   06/18/14 2200  vancomycin (VANCOCIN) IVPB 1000 mg/200 mL premix  Status:  Discontinued     1,000 mg200 mL/hr over 60 Minutes Intravenous Every 8 hours 06/18/14 1804 06/20/14 0732   06/18/14 1800  vancomycin (VANCOCIN) IVPB 1000 mg/200 mL premix     1,000 mg200 mL/hr over 60 Minutes Intravenous  Once 06/18/14 1758 06/18/14 2144   06/18/14 1745  piperacillin-tazobactam (ZOSYN) IVPB 3.375 g     3.375 g100 mL/hr over 30 Minutes Intravenous  Once 06/18/14 1732 06/18/14 2035   06/18/14 1500  vancomycin (VANCOCIN) IVPB 1000 mg/200 mL premix  Status:  Discontinued     1,000 mg200 mL/hr over 60 Minutes Intravenous  Once 06/18/14 1453 06/18/14 1457      Medications: Scheduled Meds: . docusate sodium  100 mg Oral BID  . glimepiride  2 mg Oral Q breakfast  . heparin  5,000 Units Subcutaneous 3 times per day  . insulin aspart  0-15 Units Subcutaneous TID WC  . lidocaine  10 mL Intradermal Once  . metFORMIN  500 mg Oral BID WC  . metoprolol tartrate  12.5  mg Oral BID  . morphine  30 mg Oral BID  . sodium chloride  3 mL Intravenous Q12H  . [START ON 07/01/2014] sulfamethoxazole-trimethoprim  2 tablet Oral Q12H  . vancomycin  1,000 mg Intravenous Q8H   Continuous Infusions:  PRN Meds:.acetaminophen **OR** acetaminophen, HYDROcodone-homatropine, HYDROmorphone (DILAUDID) injection, morphine, ondansetron **OR** ondansetron (ZOFRAN) IV    Objective: Weight change:   Intake/Output Summary (Last 24 hours) at 06/22/14 1619 Last data filed at 06/22/14 1400  Gross per 24 hour  Intake    480 ml  Output      0 ml  Net    480 ml   Blood pressure 135/64, pulse 124, temperature 99.8 F (37.7 C), temperature source Oral, resp. rate 18, height 5\' 5"  (1.651 m), weight 164 lb 6.4 oz (74.571 kg), SpO2 97 %. Temp:  [99.3 F (37.4 C)-102.5 F (39.2 C)] 99.8 F (37.7 C) (12/04 1337) Pulse Rate:  [124-144] 124 (12/04 1337) Resp:  [18-26] 18 (12/04 1337) BP: (122-150)/(64-95) 135/64 mmHg (12/04 1337) SpO2:  [91 %-97 %] 97 % (12/04 1337)  Physical Exam: General: Alert and awake,weated with hand bandaged conversing with daughter Neuro: nonfocal  CBC:  CBC Latest Ref Rng 06/21/2014 06/20/2014 06/19/2014  WBC 4.0 - 10.5 K/uL 13.9(H) 13.4(H) 11.5(H)  Hemoglobin 13.0 - 17.0 g/dL 12.3(L) 12.2(L) 10.6(L)  Hematocrit 39.0 - 52.0 % 35.9(L) 35.4(L) 31.3(L)  Platelets 150 - 400 K/uL 111(L) 95(L) 84(L)      BMET  Recent Labs  06/21/14 0427 06/22/14 0530  NA 135* 130*  K 3.1* 3.9  CL 93* 91*  CO2 25 24  GLUCOSE 161* 155*  BUN 5* 9  CREATININE 0.51  0.51 0.92  CALCIUM 8.4 8.8     Liver Panel   Recent Labs  06/20/14 0515  PROT 7.1  ALBUMIN 2.0*  AST 42*  ALT 46  ALKPHOS 591*  BILITOT 0.6       Sedimentation Rate No results for input(s): ESRSEDRATE in the last 72 hours. C-Reactive Protein No results for input(s): CRP in the last 72 hours.  Micro Results: Recent Results (from the past 720 hour(s))  Wound culture     Status:  None   Collection Time: 06/18/14  1:13 PM  Result Value Ref Range Status   Specimen Description WOUND RIGHT HAND  Final   Special Requests NONE  Final   Gram Stain   Final    NO WBC SEEN NO SQUAMOUS EPITHELIAL CELLS SEEN MODERATE GRAM POSITIVE COCCI IN PAIRS Performed at [facility]    Culture   Final    ABUNDANT METHICILLIN RESISTANT STAPHYLOCOCCUS AUREUS Note: RIFAMPIN AND GENTAMICIN SHOULD NOT BE USED AS SINGLE DRUGS FOR TREATMENT OF STAPH INFECTIONS. This organism DOES NOT demonstrate inducible Clindamycin resistance in vitro. CRITICAL RESULT CALLED TO, READ BACK BY AND VERIFIED WITH: Lavonda Jumbo @  [phone] ON [id number] BY [facility] Performed at [facility]    Report Status 06/21/2014 FINAL  Final   Organism ID, Bacteria METHICILLIN RESISTANT STAPHYLOCOCCUS AUREUS  Final      Susceptibility   Methicillin resistant staphylococcus aureus - MIC*    CLINDAMYCIN <=0.25 SENSITIVE Sensitive     ERYTHROMYCIN >=8 RESISTANT Resistant     GENTAMICIN <=0.5 SENSITIVE Sensitive     LEVOFLOXACIN 4 INTERMEDIATE Intermediate     OXACILLIN >=4 RESISTANT Resistant     PENICILLIN >=0.5 RESISTANT Resistant     RIFAMPIN <=0.5 SENSITIVE Sensitive     TRIMETH/SULFA <=10 SENSITIVE Sensitive     VANCOMYCIN <=0.5 SENSITIVE Sensitive     TETRACYCLINE <=1 SENSITIVE Sensitive     * ABUNDANT METHICILLIN RESISTANT STAPHYLOCOCCUS AUREUS  Anaerobic culture     Status: None (Preliminary result)   Collection Time: 06/18/14  2:00 PM  Result Value Ref Range Status   Specimen Description WOUND RT HAND  Final   Special Requests Normal  Final   Gram Stain   Final    NO WBC SEEN NO SQUAMOUS EPITHELIAL CELLS SEEN RARE GRAM POSITIVE COCCI IN PAIRS Performed at [facility]    Culture   Final    NO ANAEROBES ISOLATED; CULTURE IN PROGRESS FOR 5 DAYS Performed at [facility]    Report Status PENDING  Incomplete    Studies/Results: Ct Angio Chest Pe W/cm &/or Wo Cm  06/21/2014    CLINICAL DATA:  Tachycardia. Cough. Recent diagnosis of lung cancer. Elevated white blood count.  EXAM: CT ANGIOGRAPHY CHEST WITH CONTRAST  TECHNIQUE: Multidetector CT imaging of the chest was performed using the standard protocol during bolus administration of intravenous contrast. Multiplanar CT image reconstructions and MIPs were obtained to evaluate the vascular anatomy.  CONTRAST:  59mL OMNIPAQUE IOHEXOL 350 MG/ML SOLN  COMPARISON:  CT scan dated 05/09/2014  FINDINGS: There are several small emboli in the left lower lobe and 1 tiny embolus in the  right lower lobe. There has been marked progression of the tumor in the left upper lobe with increased extension into the left hilum and along the left side of the pericardium. There is now a new pericardial effusion which is probably a malignant effusion. There is now compression of the left main pulmonary artery as well as more peripheral branches. There is obstruction of 1 of the left upper lobe pulmonary arteries as it originates from the left main pulmonary artery.  There is also a new low loculated left pleural effusion posteriorly and laterally. The extensive nodular metastases have markedly increased throughout both lungs. There is no lymphangitic tumor at the left lung base. There are cavitary lesions in the lingula and left lower lobe which are new.  The osseous metastases have all progressed particularly in the thoracic spine. Pathologic fracture of superior endplate of D03 is more pronounced. No tumor extension and the to the spinal canal at this time. The visualized portion of the upper abdomen is normal.  Review of the MIP images confirms the above findings.  IMPRESSION: 1. Small pulmonary emboli in both lower lobes. 2. Marked progression of tumor in the left hemithorax with extension into the pericardium and marked progression in the left hilum with compression of the pulmonary arteries. 3. New loculated left pleural effusion. 4. Marked progression of  innumerable nodular metastases in both lungs with new cavitary metastases in the lingula and left lower lobe. 5. Marked progression of diffuse osseous metastases in the thoracic spine.   Electronically Signed   By: Rozetta Nunnery M.D.   On: 06/21/2014 15:33      Assessment/Plan:  Principal Problem:   Cellulitis and abscess Active Problems:   Primary lung cancer with metastasis from lung to other site   Hyponatremia   Abscess of thumb   Tachycardia    Jes Costales is a 48 y.o. male with  Metastatic lung cancer with extensive progression and hand abscess with MRSA  #1 Hand abscess with MRSA: pt wishes to go home today and was too logistically difficult to get [facility] to [facility] of ORITAVANCIN, fine to send him home with oral bactrim 2 DS BID until he can get [facility]     LOS: 4 days   Alcide Evener 06/22/2014, 4:19 PM

## 2014-06-22 NOTE — Progress Notes (Signed)
06/22/14 Edwyna Shell RN BSN CM [phone] Spoke with patient friend (church member), Ara Kussmaul, and she stated that the patient's daughter is aware that she is to come to the hospital today to learn how to change the patient's dressing prior to discharge home. Also communicated the appointment information to [facility] for infusion of the IV antibiotic on Monday at 11:00 and informed her that it is in the [facility] building on the 3rd floor. Wells Guiles stated that she will either manage the transportation or arrange transportation to the appointment. No other questions or concerns.

## 2014-06-22 NOTE — Progress Notes (Signed)
ANTIBIOTIC CONSULT NOTE - follow up  Pharmacy Consult for vancomycin and Zosyn Indication: cellulitis L thumb with associated abscess,s/p I&D 11/30  No Known Allergies  Patient Measurements: Weight= 74.6kg on 06/19/14   Vital Signs: Temp: 99.3 F (37.4 C) (12/04 0510) Temp Source: Oral (12/04 0510) BP: 138/95 mmHg (12/04 0510) Pulse Rate: 124 (12/04 0510)  Labs:  Recent Labs  06/20/14 0515 06/21/14 0427 06/22/14 0530  WBC 13.4* 13.9*  --   HGB 12.2* 12.3*  --   PLT 95* 111*  --   CREATININE 0.49* 0.51  0.51 0.92    Medical History: Past Medical History  Diagnosis Date  . 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 Reflux   . Primary lung cancer with metastasis from lung to other site 05/11/2014    Medications:  Scheduled:  . docusate sodium  100 mg Oral BID  . glimepiride  2 mg Oral Q breakfast  . heparin  5,000 Units Subcutaneous 3 times per day  . insulin aspart  0-15 Units Subcutaneous TID WC  . lidocaine  10 mL Intradermal Once  . metFORMIN  500 mg Oral BID WC  . metoprolol tartrate  12.5 mg Oral BID  . morphine  30 mg Oral BID  . sodium chloride  3 mL Intravenous Q12H  . sulfamethoxazole-trimethoprim  2 tablet Oral Q12H  . vancomycin  1,000 mg Intravenous Q8H   Infusions:    Assessment: 53 yoM admitted 11/30 with L thumb cellulitis. Pt is currently under the care of hospice for lung cancer and reported removing a bump on his L thumb nail 2 days PTA. The area was edematous with purulent drainage. L hand Xray showed soft tissue swelling without bony abnormality. I&D was performed 11/30 and culture sent. Noted patient with recent admission 10/17-10/24 for neck swelling, with some concerns for latent TB. Pharmacy was consulted to dose vancomycin and Zosyn for cellulitis with associated abscess.  Abundant MRSA on wound culture; per ID will receive oritavancin at discharge later today.  To continue vancomycin until then  11/30 >> vancomycin >> 11/30 >> Zosyn >>   12/3  Tmax 100.3 WBC 13.4 (up slightly from yesterday) Renal: CrCl still good but SCr nearly doubled overnight 0.51 >> 0.92  Microbiology 11/30 wound Cx: abundant MRSA sens clinda, bactrim, vancomycin   Goal of Therapy:  Appropriate antibiotic dosing for indication and renal function; eradication of infection. Vancomycin trough 10-15  Plan:  Bump in SCr concerning for vancomycin-induced renal injury, but planning discharge for today, so OK to receive one more dose at 1600 today if still here. Continue vancomycin 1g IV q8h Follow transition to oritavancin  Reuel Boom, PharmD Pager: [phone] 06/22/2014, 10:28 AM

## 2014-06-22 NOTE — Discharge Instructions (Signed)
Care for your wound at home in the same manner as has been done here in the hospital--washing it under running water, then re-applying a dry dressing afterward.  Do this at least once daily.

## 2014-06-22 NOTE — Progress Notes (Signed)
Pt left at this time with his daughter, headed home with hospice. Difficult discharge, as pt is non-English speaking. Daughter aware. Discharge instructions/prescritpions given/explained with daughter verbalizing understanding. Hospice aware of discharge. Dressing to left hand demonstrated and explained to daughter of pt. Supplies given.

## 2014-06-22 NOTE — Progress Notes (Signed)
Inpatient RN visit-Russell Stanley [facility] 5 E Room 1520[facility] RN Visit-Karen Alford Highland RN. This is a NON Related, non covered admission to [facility] diagnosis of Lung Cancer. Patient is now a DNR. Patient alert, sitting up in the recliner. Language line used for interpreter.  Patient reports his pain is better today, cough remains under control, no cough noted during visit. Current plan is for patient to discharge by car, his daughter is coming to pick him up. Instructions regarding dressing changes and new medications will be provided by hospital staff upon her arrival.  Writer has had several conversations with [facility] Seth Bake, attending physician Dr. Wynelle Cleveland and in addition patient's church friend Wells Guiles regarding discharge plan. [facility] administration of Oritavancin, a long acting abt. Wells Guiles to provide transportation. Pt will discharge on Bactrim for the weekend. New prescriptions profiled with [facility] Clinical manager Mariel Kansky.[facility] will continue to follow patient at home. Thank you. Please contact hospice @ [phone] with any hospice needs. Flo Shanks RN, BSN, [facility] liaison [phone]

## 2014-06-22 NOTE — Discharge Summary (Signed)
Physician Discharge Summary  Russell Stanley NAT:[id number] DOB: 1966/04/15 DOA: 06/18/2014  PCP: No PCP Per Patient  Admit date: 06/18/2014 Discharge date: 06/25/2014  Time spent: 45 minutes  Recommendations for Outpatient Follow-up:  1. Home with hospice  Discharge Condition: stable Diet recommendation: regular diet  Discharge Diagnoses:  Principal Problem:   Abscess of thumb Active Problems:   Primary lung cancer with metastasis from lung to other site   Hyponatremia   Tachycardia   History of present illness:  48 y.o. 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 Now on home hospice but remains full code. Not sure if they understand prognosis. Admitted for Left thumb abscess- swelling and redness has been present for a couple days. He finished radiation therapy roughly 3 weeks ago. His white count is elevated but without signs/symptoms of systemic illness .Seen by Dr Grandville Silos ,who performed bedside I&D, admitted for iv abx .  Hospital Course:  Left thumb abscess, likely extending from paronychium -cultures reveal MRSA- appreciate ID recommendations- plan for Oritavancin as outpt on Monday after discharge - for now will start Bactrim DS 1 tab BID for 10 days. Significantly elevated CRP at 18.6 Leukocytosis improving Followed by ortho surgery- Dr. Grandville Silos - WOC recommendations noted- BID dressings  Tachycardia - found to have extensively advanced tumor in chest with compression of pulmonary arteries and extension into the pericardium along with a loculated pleural effusion and progression of "innumerable" nodular metastasis in both lungs along with new cavitary metastasis. -Small pulmonary emboli also noted -At this point prognosis is extremely  grave-would not begin anticoagulation for PE as lung mass is abbuting the pericardium and pushing on the pulm vasculature and hemorrhage is inevitable -Have discussed these findings in detail with patient in presence of his daughter- He appears to understand- have discussed code status and he would like to change it to DNR  Brain Mets -  S/p surgical resection and radiation- weaned off of Decadron- d/w Dr Alvy Bimler who is in agreement   Hyperglycemia secondary to steroids - A1c- 7.5 - Started on Lantus and sliding scale insulin - transitioned to Metformin and Amaryl today sugars dropped to 65 due to poor PO intake therefore, stopped Amaryl prior to discharge  Hyponatremia  Likely due to SIADH  - Na 129 on admission and now 130   Hypokalemia - replaced  Elevated alkaline phosphatase Likely secondary to bony metastasis   Procedures:  11/30 I and D of left thumb  Consultations:  Ortho  ID  Discharge Exam: Filed Weights   06/19/14 0833  Weight: 74.571 kg (164 lb 6.4 oz)   Filed Vitals:   06/22/14 1337  BP: 135/64  Pulse: 124  Temp: 99.8 F (37.7 C)  Resp: 18    General: AAO x 3, no distress Cardiovascular: RRR, no murmurs  Respiratory: clear to auscultation bilaterally GI: soft, non-tender, non-distended, bowel sound positive Extermities: no cyanosis clubbing or edema, left hand in dressing  Discharge Instructions You were cared for by a hospitalist during your hospital stay. If you have any questions about your discharge medications or the care you received while you were in the hospital after you are discharged, you can call the unit and asked to speak with the hospitalist on call if the hospitalist that took care of you is not available.  Once you are discharged, your primary care physician will handle any further medical issues. Please note that NO REFILLS for any discharge medications will be authorized once you are discharged, as it is imperative that you return  to your primary care physician (or establish a relationship with a primary care physician if you do not have one) for your aftercare needs so that they can reassess your need for medications and monitor your lab values.  Discharge Instructions    Diet - low sodium heart healthy    Complete by:  As directed      Increase activity slowly    Complete by:  As directed             Medication List    STOP taking these medications        dexamethasone 4 MG tablet  Commonly known as:  DECADRON      TAKE these medications        acetaminophen 325 MG tablet  Commonly known as:  TYLENOL  Take 2 tablets (650 mg total) by mouth every 6 (six) hours as needed for mild pain (or Fever >/= 101).     DSS 100 MG Caps  - Take 100 mg by mouth 2 (two) times daily. Qty 60   - 3 refills  - Called into [facility]     HYDROcodone-homatropine 5-1.5 MG/5ML syrup  Commonly known as:  HYCODAN  Take 5 mLs by mouth every 6 (six) hours as needed for cough.     metFORMIN 500 MG tablet  Commonly known as:  GLUCOPHAGE  Take 1 tablet (500 mg total) by mouth 2 (two) times daily with a meal.     metoprolol tartrate 12.5 mg Tabs tablet  Commonly known as:  LOPRESSOR  Take 0.5 tablets (12.5 mg total) by mouth 2 (two) times daily.     morphine 30 MG 12 hr tablet  Commonly known as:  MS CONTIN  Take 1 tablet (30 mg total) by mouth 2 (two) times daily.     morphine 15 MG tablet  Commonly known as:  MSIR  Take 0.5-1 tablets (7.5-15 mg total) by mouth every 4 (four) hours as needed for severe pain.     pantoprazole 40 MG tablet  Commonly known as:  PROTONIX  Take 1 tablet (40 mg total) by mouth daily.          No Known Allergies Follow-up Information    Follow up with Jolyn Nap., MD.   Specialty:  Orthopedic Surgery   Why:  for the week of 07-02-14   Contact information:   [address] [place] [address] [phone]        The results of significant  diagnostics from this hospitalization (including imaging, microbiology, ancillary and laboratory) are listed below for reference.    Significant Diagnostic Studies: Ct Angio Chest Pe W/cm &/or Wo Cm  06/21/2014   CLINICAL DATA:  Tachycardia. Cough. Recent diagnosis of lung cancer. Elevated white blood count.  EXAM: CT ANGIOGRAPHY CHEST WITH CONTRAST  TECHNIQUE: Multidetector CT imaging of the chest was performed using the standard protocol during bolus administration of intravenous contrast. Multiplanar CT image reconstructions and MIPs were obtained to evaluate the vascular anatomy.  CONTRAST:  75mL OMNIPAQUE IOHEXOL 350 MG/ML SOLN  COMPARISON:  CT scan dated 05/09/2014  FINDINGS: There are several small emboli in the left lower lobe and 1 tiny embolus in the right lower lobe. There has been marked progression of the tumor  in the left upper lobe with increased extension into the left hilum and along the left side of the pericardium. There is now a new pericardial effusion which is probably a malignant effusion. There is now compression of the left main pulmonary artery as well as more peripheral branches. There is obstruction of 1 of the left upper lobe pulmonary arteries as it originates from the left main pulmonary artery.  There is also a new low loculated left pleural effusion posteriorly and laterally. The extensive nodular metastases have markedly increased throughout both lungs. There is no lymphangitic tumor at the left lung base. There are cavitary lesions in the lingula and left lower lobe which are new.  The osseous metastases have all progressed particularly in the thoracic spine. Pathologic fracture of superior endplate of T73 is more pronounced. No tumor extension and the to the spinal canal at this time. The visualized portion of the upper abdomen is normal.  Review of the MIP images confirms the above findings.  IMPRESSION: 1. Small pulmonary emboli in both lower lobes. 2. Marked progression of  tumor in the left hemithorax with extension into the pericardium and marked progression in the left hilum with compression of the pulmonary arteries. 3. New loculated left pleural effusion. 4. Marked progression of innumerable nodular metastases in both lungs with new cavitary metastases in the lingula and left lower lobe. 5. Marked progression of diffuse osseous metastases in the thoracic spine.   Electronically Signed   By: Rozetta Nunnery M.D.   On: 06/21/2014 15:33   Dg Hand Complete Left  06/18/2014   CLINICAL DATA:  Left thumb pain for 3 days. Swelling and redness in the left thumb.  EXAM: LEFT HAND - COMPLETE 3+ VIEW  COMPARISON:  None.  FINDINGS: Diffuse soft tissue swelling of the thumb. No evidence for a fracture or dislocation. No evidence for cortical destruction or periosteal reaction. Alignment of the left hand and is normal.  IMPRESSION: Soft tissue swelling without acute bone abnormality.   Electronically Signed   By: Markus Daft M.D.   On: 06/18/2014 14:26    Microbiology: Recent Results (from the past 240 hour(s))  Wound culture     Status: None   Collection Time: 06/18/14  1:13 PM  Result Value Ref Range Status   Specimen Description WOUND RIGHT HAND  Final   Special Requests NONE  Final   Gram Stain   Final    NO WBC SEEN NO SQUAMOUS EPITHELIAL CELLS SEEN MODERATE GRAM POSITIVE COCCI IN PAIRS Performed at [facility]    Culture   Final    ABUNDANT METHICILLIN RESISTANT STAPHYLOCOCCUS AUREUS Note: RIFAMPIN AND GENTAMICIN SHOULD NOT BE USED AS SINGLE DRUGS FOR TREATMENT OF STAPH INFECTIONS. This organism DOES NOT demonstrate inducible Clindamycin resistance in vitro. CRITICAL RESULT CALLED TO, READ BACK BY AND VERIFIED WITH: Lavonda Jumbo @  [phone] ON [id number] BY [facility] Performed at [facility]    Report Status 06/21/2014 FINAL  Final   Organism ID, Bacteria METHICILLIN RESISTANT STAPHYLOCOCCUS AUREUS  Final      Susceptibility   Methicillin resistant  staphylococcus aureus - MIC*    CLINDAMYCIN <=0.25 SENSITIVE Sensitive     ERYTHROMYCIN >=8 RESISTANT Resistant     GENTAMICIN <=0.5 SENSITIVE Sensitive     LEVOFLOXACIN 4 INTERMEDIATE Intermediate     OXACILLIN >=4 RESISTANT Resistant     PENICILLIN >=0.5 RESISTANT Resistant     RIFAMPIN <=0.5 SENSITIVE Sensitive     TRIMETH/SULFA <=10 SENSITIVE Sensitive  VANCOMYCIN <=0.5 SENSITIVE Sensitive     TETRACYCLINE <=1 SENSITIVE Sensitive     * ABUNDANT METHICILLIN RESISTANT STAPHYLOCOCCUS AUREUS  Anaerobic culture     Status: None   Collection Time: 06/18/14  2:00 PM  Result Value Ref Range Status   Specimen Description WOUND RT HAND  Final   Special Requests Normal  Final   Gram Stain   Final    NO WBC SEEN NO SQUAMOUS EPITHELIAL CELLS SEEN RARE GRAM POSITIVE COCCI IN PAIRS Performed at [facility]    Culture   Final    NO ANAEROBES ISOLATED Performed at [facility]    Report Status 06/24/2014 FINAL  Final     Labs: Basic Metabolic Panel:  Recent Labs Lab 06/18/14 1902 06/19/14 0500 06/20/14 0515 06/21/14 0427 06/21/14 1330 06/22/14 0530  NA  --  132* 132* 135*  --  130*  K  --  3.9 3.6* 3.1*  --  3.9  CL  --  92* 93* 93*  --  91*  CO2  --  27 24 25   --  24  GLUCOSE  --  206* 117* 161*  --  155*  BUN  --  12 11 5*  --  9  CREATININE  --  0.45* 0.49* 0.51  0.51  --  0.92  CALCIUM  --  8.5 8.7 8.4  --  8.8  MG 2.3  --   --   --  1.7  --    Liver Function Tests:  Recent Labs Lab 06/19/14 0500 06/20/14 0515  AST 22 42*  ALT 45 46  ALKPHOS 512* 591*  BILITOT 0.5 0.6  PROT 6.8 7.1  ALBUMIN 2.1* 2.0*   No results for input(s): LIPASE, AMYLASE in the last 168 hours. No results for input(s): AMMONIA in the last 168 hours. CBC:  Recent Labs Lab 06/19/14 0500 06/20/14 0515 06/21/14 0427  WBC 11.5* 13.4* 13.9*  HGB 10.6* 12.2* 12.3*  HCT 31.3* 35.4* 35.9*  MCV 86.7 86.6 85.7  PLT 84* 95* 111*   Cardiac Enzymes: No results for  input(s): CKTOTAL, CKMB, CKMBINDEX, TROPONINI in the last 168 hours. BNP: BNP (last 3 results) No results for input(s): PROBNP in the last 8760 hours. CBG:  Recent Labs Lab 06/21/14 1145 06/21/14 1637 06/21/14 2153 06/22/14 0732 06/22/14 1139  GLUCAP 87 115* 149* 230* 65*       SignedDebbe Odea, MD [facility] 06/25/2014, 4:59 PM

## 2014-06-22 NOTE — Plan of Care (Signed)
Problem: Discharge Progression Outcomes Goal: Barriers To Progression Addressed/Resolved Outcome: Completed/Met Date Met:  06/22/14 Goal: Discharge plan in place and appropriate Outcome: Completed/Met Date Met:  06/22/14 Goal: Pain controlled with appropriate interventions Outcome: Completed/Met Date Met:  06/22/14 Goal: Hemodynamically stable Outcome: Completed/Met Date Met:  97/33/12 Goal: Complications resolved/controlled Outcome: Completed/Met Date Met:  06/22/14 Goal: Tolerating diet Outcome: Completed/Met Date Met:  06/22/14 Goal: Activity appropriate for discharge plan Outcome: Completed/Met Date Met:  06/22/14 Goal: Tubes and drains discontinued if indicated Outcome: Completed/Met Date Met:  06/22/14 Goal: Staples/sutures removed Outcome: Not Applicable Date Met:  50/87/19 Goal: Steri-Strips applied Outcome: Not Applicable Date Met:  94/12/90 Goal: Other Discharge Outcomes/Goals Outcome: Completed/Met Date Met:  06/22/14

## 2014-06-24 LAB — ANAEROBIC CULTURE
GRAM STAIN: NONE SEEN
Special Requests: NORMAL

## 2014-06-25 ENCOUNTER — Non-Acute Institutional Stay (HOSPITAL_COMMUNITY)
Admission: AD | Admit: 2014-06-25 | Discharge: 2014-06-25 | Disposition: A | Discharge status: Home / Self Care | Payer: BC Managed Care – PPO | Source: Ambulatory Visit | Attending: Internal Medicine | Admitting: Internal Medicine

## 2014-06-25 DIAGNOSIS — C7931 Secondary malignant neoplasm of brain: Secondary | ICD-10-CM | POA: Diagnosis present

## 2014-06-25 DIAGNOSIS — L02512 Cutaneous abscess of left hand: Secondary | ICD-10-CM | POA: Diagnosis not present

## 2014-06-25 DIAGNOSIS — L02519 Cutaneous abscess of unspecified hand: Secondary | ICD-10-CM | POA: Diagnosis present

## 2014-06-25 DIAGNOSIS — C3490 Malignant neoplasm of unspecified part of unspecified bronchus or lung: Secondary | ICD-10-CM | POA: Diagnosis present

## 2014-06-25 DIAGNOSIS — E871 Hypo-osmolality and hyponatremia: Secondary | ICD-10-CM | POA: Diagnosis present

## 2014-06-25 MED ORDER — ONDANSETRON 8 MG/NS 50 ML IVPB
8.0000 mg | Freq: Once | INTRAVENOUS | Status: DC | PRN
  Filled 2014-06-25: qty 8

## 2014-06-25 MED ORDER — ORITAVANCIN DIPHOSPHATE 400 MG IV SOLR
1200.0000 mg | Freq: Once | INTRAVENOUS | Status: AC
  Administered 2014-06-25: 1200 mg via INTRAVENOUS
  Filled 2014-06-25: qty 120

## 2014-06-25 MED ORDER — DIPHENHYDRAMINE HCL 50 MG/ML IJ SOLN: 25.0000 mg | Freq: Once | INTRAMUSCULAR | Status: DC | PRN

## 2014-06-25 NOTE — Progress Notes (Signed)
Diagnosis: Abscess of Left thumb  Pt received and tolerated oritavancin without any difficulty.  Pt discharged home in stable condition.

## 2014-06-25 NOTE — Progress Notes (Signed)
Pt tolerated oritavancin infusion without any immediate complications.  End time of infusion 1558.  Dr. Baxter Flattery text paged to make aware that pt tolerated infusion without difficulty.

## 2014-06-25 NOTE — Progress Notes (Signed)
Language line utilized for interpreter services.  Interpreter name: OO  ID number [id number].  Patient verbalizes understanding of infusion of Oritavancin, and denies any questions at this time.

## 2014-06-26 ENCOUNTER — Ambulatory Visit: Payer: No Typology Code available for payment source | Admitting: Radiation Oncology

## 2014-07-31 ENCOUNTER — Emergency Department (HOSPITAL_COMMUNITY)

## 2014-07-31 ENCOUNTER — Encounter (HOSPITAL_COMMUNITY): Payer: Self-pay

## 2014-07-31 ENCOUNTER — Inpatient Hospital Stay (HOSPITAL_COMMUNITY)
Admission: EM | Admit: 2014-07-31 | Discharge: 2014-08-20 | DRG: 871 | Disposition: E | Attending: Internal Medicine | Admitting: Internal Medicine

## 2014-07-31 DIAGNOSIS — R0600 Dyspnea, unspecified: Secondary | ICD-10-CM | POA: Diagnosis not present

## 2014-07-31 DIAGNOSIS — J189 Pneumonia, unspecified organism: Secondary | ICD-10-CM | POA: Diagnosis present

## 2014-07-31 DIAGNOSIS — R0989 Other specified symptoms and signs involving the circulatory and respiratory systems: Secondary | ICD-10-CM | POA: Diagnosis present

## 2014-07-31 DIAGNOSIS — B001 Herpesviral vesicular dermatitis: Secondary | ICD-10-CM | POA: Diagnosis present

## 2014-07-31 DIAGNOSIS — Y95 Nosocomial condition: Secondary | ICD-10-CM | POA: Diagnosis present

## 2014-07-31 DIAGNOSIS — J9601 Acute respiratory failure with hypoxia: Secondary | ICD-10-CM | POA: Diagnosis present

## 2014-07-31 DIAGNOSIS — A419 Sepsis, unspecified organism: Principal | ICD-10-CM | POA: Diagnosis present

## 2014-07-31 DIAGNOSIS — Z515 Encounter for palliative care: Secondary | ICD-10-CM | POA: Diagnosis not present

## 2014-07-31 DIAGNOSIS — C3490 Malignant neoplasm of unspecified part of unspecified bronchus or lung: Secondary | ICD-10-CM | POA: Diagnosis not present

## 2014-07-31 DIAGNOSIS — Z66 Do not resuscitate: Secondary | ICD-10-CM | POA: Diagnosis present

## 2014-07-31 DIAGNOSIS — C7931 Secondary malignant neoplasm of brain: Secondary | ICD-10-CM | POA: Diagnosis present

## 2014-07-31 DIAGNOSIS — B002 Herpesviral gingivostomatitis and pharyngotonsillitis: Secondary | ICD-10-CM | POA: Diagnosis present

## 2014-07-31 DIAGNOSIS — G893 Neoplasm related pain (acute) (chronic): Secondary | ICD-10-CM | POA: Diagnosis present

## 2014-07-31 DIAGNOSIS — C799 Secondary malignant neoplasm of unspecified site: Secondary | ICD-10-CM | POA: Diagnosis not present

## 2014-07-31 DIAGNOSIS — Z79899 Other long term (current) drug therapy: Secondary | ICD-10-CM

## 2014-07-31 DIAGNOSIS — R000 Tachycardia, unspecified: Secondary | ICD-10-CM | POA: Diagnosis present

## 2014-07-31 DIAGNOSIS — Z8614 Personal history of Methicillin resistant Staphylococcus aureus infection: Secondary | ICD-10-CM

## 2014-07-31 DIAGNOSIS — R0689 Other abnormalities of breathing: Secondary | ICD-10-CM

## 2014-07-31 DIAGNOSIS — E872 Acidosis: Secondary | ICD-10-CM | POA: Diagnosis present

## 2014-07-31 LAB — COMPREHENSIVE METABOLIC PANEL
ALT: 21 U/L (ref 0–53)
AST: 45 U/L — AB (ref 0–37)
Albumin: 3.5 g/dL (ref 3.5–5.2)
Alkaline Phosphatase: 630 U/L — ABNORMAL HIGH (ref 39–117)
Anion gap: 16 — ABNORMAL HIGH (ref 5–15)
BUN: 23 mg/dL (ref 6–23)
CALCIUM: 8.6 mg/dL (ref 8.4–10.5)
CO2: 18 mmol/L — ABNORMAL LOW (ref 19–32)
Chloride: 101 mEq/L (ref 96–112)
Creatinine, Ser: 0.72 mg/dL (ref 0.50–1.35)
GFR calc non Af Amer: >90 mL/min (ref >90)
GLUCOSE: 150 mg/dL — AB (ref 70–99)
Potassium: 4.1 mmol/L (ref 3.5–5.1)
SODIUM: 135 mmol/L (ref 135–145)
TOTAL PROTEIN: 8.3 g/dL (ref 6.0–8.3)
Total Bilirubin: 1.1 mg/dL (ref 0.3–1.2)

## 2014-07-31 LAB — CBC WITH DIFFERENTIAL/PLATELET
BASOS ABS: 0 10*3/uL (ref 0.0–0.1)
Basophils Relative: 0 % (ref 0–1)
Eosinophils Absolute: 0 10*3/uL (ref 0.0–0.7)
Eosinophils Relative: 0 % (ref 0–5)
HEMATOCRIT: 34.7 % — AB (ref 39.0–52.0)
HEMOGLOBIN: 11.4 g/dL — AB (ref 13.0–17.0)
LYMPHS ABS: 2.1 10*3/uL (ref 0.7–4.0)
LYMPHS PCT: 8 % — AB (ref 12–46)
MCH: 29.5 pg (ref 26.0–34.0)
MCHC: 32.9 g/dL (ref 30.0–36.0)
MCV: 89.9 fL (ref 78.0–100.0)
MONO ABS: 1.3 10*3/uL — AB (ref 0.1–1.0)
Monocytes Relative: 5 % (ref 3–12)
NEUTROS ABS: 23.5 10*3/uL — AB (ref 1.7–7.7)
Neutrophils Relative %: 87 % — ABNORMAL HIGH (ref 43–77)
Platelets: 111 10*3/uL — ABNORMAL LOW (ref 150–400)
RBC: 3.86 MIL/uL — AB (ref 4.22–5.81)
RDW: 17.5 % — ABNORMAL HIGH (ref 11.5–15.5)
WBC: 27 10*3/uL — AB (ref 4.0–10.5)

## 2014-07-31 LAB — BRAIN NATRIURETIC PEPTIDE: B Natriuretic Peptide: 162.5 pg/mL — ABNORMAL HIGH (ref 0.0–100.0)

## 2014-07-31 LAB — LIPASE, BLOOD: Lipase: 27 U/L (ref 11–59)

## 2014-07-31 LAB — I-STAT CG4 LACTIC ACID, ED: Lactic Acid, Venous: 4.74 mmol/L — ABNORMAL HIGH (ref 0.5–2.2)

## 2014-07-31 LAB — TROPONIN I: Troponin I: 0.19 ng/mL — ABNORMAL HIGH (ref <0.031)

## 2014-07-31 MED ORDER — MORPHINE SULFATE 4 MG/ML IJ SOLN
4.0000 mg | Freq: Once | INTRAMUSCULAR | Status: AC
  Administered 2014-07-31: 4 mg via INTRAVENOUS
  Filled 2014-07-31: qty 1

## 2014-07-31 MED ORDER — MORPHINE SULFATE 25 MG/ML IV SOLN
5.0000 mg/h | INTRAVENOUS | Status: DC
  Administered 2014-07-31: 1 mg/h via INTRAVENOUS
  Filled 2014-07-31: qty 10

## 2014-07-31 MED ORDER — FLEET ENEMA 7-19 GM/118ML RE ENEM: 1.0000 | ENEMA | Freq: Once | RECTAL | Status: AC | PRN

## 2014-07-31 MED ORDER — SCOPOLAMINE 1 MG/3DAYS TD PT72
1.0000 | MEDICATED_PATCH | TRANSDERMAL | Status: DC
  Administered 2014-08-03: 1.5 mg via TRANSDERMAL
  Filled 2014-07-31: qty 1

## 2014-07-31 MED ORDER — HALOPERIDOL LACTATE 2 MG/ML PO CONC
2.0000 mg | Freq: Four times a day (QID) | ORAL | Status: DC | PRN
Start: 2014-07-31 — End: 2014-08-04
  Filled 2014-07-31: qty 1

## 2014-07-31 MED ORDER — MORPHINE SULFATE 4 MG/ML IJ SOLN
8.0000 mg | Freq: Once | INTRAMUSCULAR | Status: AC
  Administered 2014-07-31: 8 mg via INTRAVENOUS
  Filled 2014-07-31: qty 2

## 2014-07-31 MED ORDER — SODIUM CHLORIDE 0.9 % IV BOLUS (SEPSIS)
1000.0000 mL | Freq: Once | INTRAVENOUS | Status: AC
  Administered 2014-07-31: 1000 mL via INTRAVENOUS

## 2014-07-31 NOTE — H&P (Addendum)
[facility] History and Physical  Russell Stanley GXQ:[id number] DOB: 1965-09-09 DOA: 07/30/2014  Referring physician: ED PCP: No PCP Per Patient  Specialists: Palliative  Chief Complaint:   HPI:  49 y/o 1ry lung Ca c brain mets  10/17s/p T10 + brain mets XRT/, Exposue to TB as a child, Prior hand abcess c MRSA recently d/c from [facility] 06/25/14 with an abscess of the left thumb-he does not speak english-I did interview this patient with both his family as well as interpreter line Patient started feeling poorly on 07/28/14 with increasing shortness of breath, chest pain, nausea and was not able to eat and was not able to drink was not able to breathe. I spoke personally with the hospice nurse Flo Shanks who reports the patient was found on the ground at home fighting for breath, using all accessory muscles of inspiration, diaphoretic and was in visible respiratory distress with heart rate in the 140s.  Patient reportedly was discharged to home as a DO NOT RESUSCITATE but then elected to change his CODE STATUS to full CODE STATUS  as he desired further treatment .  No fever   no chills   feels a tightness in his chest and discomfort with breathing  Noted multiple lab abnormalities including a lactic acid of 4 leukocytosis of 27, platelets 111, bicarbonate of 18  anion gap of 16 alkaline phosphatase of 6:30 troponin 0.19    Past Medical History  Diagnosis Date  . 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 Reflux   . Primary lung cancer with metastasis from lung to other site 05/11/2014   History reviewed. No pertinent past surgical history. Social History:  History   Social History Narrative    No Known Allergies  Family History  Problem Relation Age of Onset  . Cancer      Father    Prior to Admission medications   Medication Sig Start Date End Date Taking? Authorizing Provider  acetaminophen (TYLENOL) 325 MG tablet Take 2 tablets (650 mg total) by mouth every 6 (six) hours as needed for  mild pain (or Fever >/= 101). 05/12/14  Yes Christina P Rama, MD  dexamethasone (DECADRON) 4 MG tablet Take 4 mg by mouth 2 (two) times daily with a meal.   Yes Historical Provider, MD  Docusate Sodium (DSS) 100 MG CAPS Take 100 mg by mouth 2 (two) times daily. Qty 60  3 refills Called into [place] 05/18/14  Yes Lora Paula, MD  HYDROcodone-homatropine [facility]) 5-1.5 MG/5ML syrup Take 5 mLs by mouth every 6 (six) hours as needed for cough.   Yes Historical Provider, MD  metFORMIN (GLUCOPHAGE) 500 MG tablet Take 1 tablet (500 mg total) by mouth 2 (two) times daily with a meal. 06/22/14  Yes Debbe Odea, MD  metoprolol tartrate (LOPRESSOR) 12.5 mg TABS tablet Take 0.5 tablets (12.5 mg total) by mouth 2 (two) times daily. 06/22/14  Yes Debbe Odea, MD  morphine (ROXANOL) 20 MG/ML concentrated solution Take 5-10 mg by mouth every 2 (two) hours as needed for severe pain or shortness of breath.   Yes Historical Provider, MD  morphine (MS CONTIN) 30 MG 12 hr tablet Take 1 tablet (30 mg total) by mouth 2 (two) times daily. Patient not taking: Reported on 07/22/2014 06/22/14   Debbe Odea, MD  morphine (MSIR) 15 MG tablet Take 0.5-1 tablets (7.5-15 mg total) by mouth every 4 (four) hours as needed for severe pain. Patient not taking: Reported on 08/07/2014 06/22/14   Eunice Blase  Rizwan, MD  pantoprazole (PROTONIX) 40 MG tablet Take 1 tablet (40 mg total) by mouth daily. 05/12/14   Venetia Maxon Rama, MD  sulfamethoxazole-trimethoprim (BACTRIM,SEPTRA) 400-80 MG per tablet Take 2 tablets by mouth every 12 (twelve) hours. Patient not taking: Reported on 07/28/2014 07/01/14   Debbe Odea, MD   Physical Exam: Filed Vitals:   07/21/2014 1304 07/25/2014 1305 07/23/2014 1420 07/26/2014 1441  BP: 109/92   107/64  Pulse: 139  133 132  Temp: 97.7 F (36.5 C)   97.6 F (36.4 C)  TempSrc: Oral   Oral  Resp: 28  34 26  SpO2: 88% 93% 96% 93%   The patient in acute respiratory distress accessory  muscles moving able to verbalize however   tachycardic 140s Abdomen soft nontender Diffuse crackles Rales bilaterally Cranial nerves intact Oriented and alert and understands conversation on telephone with interpreter  Labs on Admission:  Basic Metabolic Panel:  Recent Labs Lab 07/20/2014 1341  NA 135  K 4.1  CL 101  CO2 18*  GLUCOSE 150*  BUN 23  CREATININE 0.72  CALCIUM 8.6   Liver Function Tests:  Recent Labs Lab 07/28/2014 1341  AST 45*  ALT 21  ALKPHOS 630*  BILITOT 1.1  PROT 8.3  ALBUMIN 3.5    Recent Labs Lab 08/08/2014 1341  LIPASE 27   No results for input(s): AMMONIA in the last 168 hours. CBC:  Recent Labs Lab 08/13/2014 1341  WBC 27.0*  NEUTROABS 23.5*  HGB 11.4*  HCT 34.7*  MCV 89.9  PLT 111*   Cardiac Enzymes:  Recent Labs Lab 07/24/2014 1341  TROPONINI 0.19*    BNP (last 3 results) No results for input(s): PROBNP in the last 8760 hours. CBG: No results for input(s): GLUCAP in the last 168 hours.  Radiological Exams on Admission: Dg Chest 2 View  07/30/2014   CLINICAL DATA:  Cough and shortness of breath.  EXAM: CHEST  2 VIEW  COMPARISON:  06/21/2014.  FINDINGS: Mediastinum hilar structures normal. Diffuse pulmonary nodular densities are noted with persistent consolidation left upper lobe. Stable cystic changes left base. Small left pleural effusion again noted. Stable cardiomegaly. No acute bony abnormality.  IMPRESSION: 1. Innumerable bilateral pulmonary nodules consistent with known metastatic disease again noted. 2. Persistent consolidation left upper lobe. Persistent cystic changes left lung base. 3. Stable cardiomegaly.  No CHF.   Electronically Signed   By: Marcello Moores  Register   On: 07/30/2014 14:31    Patient probably has a right upper lobe postobstructive pneumonia versus cancer. I have spent greater than 1 hour time discussing with him his family hospice RN and emergency room physician what his options for care are I have explained  to him that he will not likely recover from this acute illness I have explained to him that putting him on a ventilator is futile-it would not change his diagnosis of stage IV metastatic lung cancer which has been treated unsuccessfully I have discussed with CCM to figure out if there is anything else we can do, and they concur that this is medically futile care if we were to do any aggressive measures I have given him options regarding going home with hospice which he states he cannot do because he is short of breath right now and having tightness in his chest and I have discussed possible transition to an outpatient freestanding hospice This patient will be given IV morphine, IV Ativan and I've asked him to alert his other family members as to the seriousness  of the nature of his illness and the likelihood that he will probably not survive the on 48-72 hours. I have alerted case management and social work as well as Barrister's clerk to this patient's case and attending in the a.m. will help with disposition  Prolonged care , 100 minutes   If 7PM-7AM, please contact night-coverage [email or web address] Password [facility] 08/15/2014, 4:16 PM

## 2014-07-31 NOTE — ED Notes (Addendum)
MD at bedside with family and telephonic pacific interpreter.

## 2014-07-31 NOTE — ED Notes (Signed)
Bed: VZ85 Expected date:  Expected time:  Means of arrival:  Comments: Ems- sbo

## 2014-07-31 NOTE — ED Notes (Signed)
Patient transported to X-ray 

## 2014-07-31 NOTE — ED Notes (Signed)
MD at bedside. 

## 2014-07-31 NOTE — ED Notes (Signed)
Unit needs time to assign patient. Floor to call back for report.

## 2014-07-31 NOTE — Progress Notes (Signed)
ED CM made another attempt to reach hospice RN, Ronn Melena at [phone] Referred her to Dr Verlon Au pager

## 2014-07-31 NOTE — ED Provider Notes (Signed)
CSN: [id number]     Arrival date & time 07/22/2014  1303 History   First MD Initiated Contact with Patient 07/24/2014 1304     Chief Complaint  Patient presents with  . Shortness of Breath     (Consider location/radiation/quality/duration/timing/severity/associated sxs/prior Treatment) HPI Patient presents with chest pain, dyspnea, cough, weakness. Symptoms seem to have begun over the past 2 days, been progressive. He denies fever, syncope. He denies vomiting or diarrhea. He does acknowledge anorexia. History of present illness obtained with the assistance of a translator. Patient does not understand the current status of his lung cancer, though this is on his medical chart as of 3 months ago. Patient denies other complaints. No relief with anything.  Past Medical History  Diagnosis Date  . 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 Reflux   . Primary lung cancer with metastasis from lung to other site 05/11/2014   History reviewed. No pertinent past surgical history. No family history on file. History  Substance Use Topics  . Smoking status: Never Smoker   . Smokeless tobacco: Current User    Types: Chew  . Alcohol Use: No    Review of Systems  Unable to perform ROS: Severe respiratory distress      Allergies  Review of patient's allergies indicates no known allergies.  Home Medications   Prior to Admission medications   Medication Sig Start Date End Date Taking? Authorizing Provider  acetaminophen (TYLENOL) 325 MG tablet Take 2 tablets (650 mg total) by mouth every 6 (six) hours as needed for mild pain (or Fever >/= 101). 05/12/14   Venetia Maxon Rama, MD  Docusate Sodium (DSS) 100 MG CAPS Take 100 mg by mouth 2 (two) times daily. Qty 60  3 refills Called into [place] 05/18/14   Lora Paula, MD  HYDROcodone-homatropine [facility]) 5-1.5 MG/5ML syrup Take 5 mLs by mouth every 6 (six) hours as needed for cough.    Historical Provider, MD  metFORMIN  (GLUCOPHAGE) 500 MG tablet Take 1 tablet (500 mg total) by mouth 2 (two) times daily with a meal. 06/22/14   Debbe Odea, MD  metoprolol tartrate (LOPRESSOR) 12.5 mg TABS tablet Take 0.5 tablets (12.5 mg total) by mouth 2 (two) times daily. 06/22/14   Debbe Odea, MD  morphine (MS CONTIN) 30 MG 12 hr tablet Take 1 tablet (30 mg total) by mouth 2 (two) times daily. 06/22/14   Debbe Odea, MD  morphine (MSIR) 15 MG tablet Take 0.5-1 tablets (7.5-15 mg total) by mouth every 4 (four) hours as needed for severe pain. 06/22/14   Debbe Odea, MD  pantoprazole (PROTONIX) 40 MG tablet Take 1 tablet (40 mg total) by mouth daily. 05/12/14   Venetia Maxon Rama, MD  sulfamethoxazole-trimethoprim (BACTRIM,SEPTRA) 400-80 MG per tablet Take 2 tablets by mouth every 12 (twelve) hours. 07/01/14   Debbe Odea, MD   BP 109/92 mmHg  Pulse 139  Temp(Src) 97.7 F (36.5 C) (Oral)  Resp 28  SpO2 93% Physical Exam  Constitutional: He is oriented to person, place, and time. He appears well-developed and well-nourished. He appears distressed.  HENT:  Head: Normocephalic and atraumatic.  Eyes: Conjunctivae are normal. Right eye exhibits no discharge. Left eye exhibits no discharge.  Neck: Neck supple.  Cardiovascular: Tachycardia present.   Pulmonary/Chest: Accessory muscle usage present. No stridor. Tachypnea noted. He is in respiratory distress. He has no decreased breath sounds. He has wheezes.  Abdominal: He exhibits no distension. There is no tenderness.  Musculoskeletal: He  exhibits no edema.  Neurological: He is alert and oriented to person, place, and time. No cranial nerve deficit. He exhibits normal muscle tone. Coordination normal.  Skin: Skin is warm. He is diaphoretic. No erythema.  Psychiatric: He has a normal mood and affect.    ED Course  Procedures (including critical care time) Labs Review Labs Reviewed  COMPREHENSIVE METABOLIC PANEL  LIPASE, BLOOD  CBC WITH DIFFERENTIAL  TROPONIN I   URINALYSIS, ROUTINE W REFLEX MICROSCOPIC  BRAIN NATRIURETIC PEPTIDE  I-STAT CG4 LACTIC ACID, ED    Imaging Review No results found.   EKG Interpretation   Date/Time:  Tuesday July 31 2014 13:13:57 EST Ventricular Rate:  134 PR Interval:  116 QRS Duration: 82 QT Interval:  312 QTC Calculation: 465 R Axis:   24 Text Interpretation:  Sinus tachycardia Otherwise normal ECG Sinus  tachycardia Abnormal ekg Confirmed by Carmin Muskrat  MD ([id number]) on  08/18/2014 1:31:52 PM     O2- 88%ra, abn  Card: 121 -stach, abn   Update: I discussed patient's case with his hospice liaison. She reports that the patient was here about one month ago, already with known metastatic disease, but was found to have progression of disease at that time. She states that the patient was previously DO NOT RESUSCITATE given the disease, lack of ongoing therapy.   3:40 PM With the assistance of the hospice nurse, we communicated the results to the patient. He continues to have dyspnea, pain. IVF and meds continue. Patient seems to be aware of his disease, the need for admission.  Patient has previously been DO NOT RESUSCITATE, though I am unable to completely confirm this.   MDM     This patient with metastatic lung cancer presents with dyspnea, chest pain, generalized discomfort. Patient is awake and alert, interacting appropriately, but is hemodynamically unstable, with multiple irregular labs, including elevated troponin, BNP, leukocytosis, lactic acidosis. Patient is previously involved with hospice care. Patient is not a candidate for additional chemotherapy according to his hospice team. Patient did feel better here with supplemental oxygen, morphine, fluids. Patient was admitted for further E/M.        Carmin Muskrat, MD 08/15/2014 [phone]

## 2014-07-31 NOTE — ED Notes (Signed)
Hospice nurse liaison Santiago Glad, RN at the bedside. Has been following patient since December admission.

## 2014-07-31 NOTE — ED Notes (Signed)
Patient has been given urinal to collect sample.

## 2014-07-31 NOTE — Progress Notes (Addendum)
ED CM spoke with Russell Stanley at hospice [phone] to attempt to get in touch with Russell Stanley for Dr Verlon Au to discuss DNR status and possible facility hospice Cm present as Dr Verlon Au spoke with pt and daughter about prognosis.  CM left with Karell contact numbers for ED PM CM and Dr Arlyss Queen pager number.  Discussed admission status  ED SW contacted about hospice facility placement To attempt fax pt referral to hospice facilities in [facility]

## 2014-07-31 NOTE — ED Notes (Signed)
Patient from home via EMS with a chief complaint of cough and shortness of breath x several days. Patient speaks minimal English, patient had total of 5mg  of albuterol nebulized with 0.5mg  of atrovent. Patient diaphoretic, HR 140's, RR 35.

## 2014-07-31 NOTE — Progress Notes (Signed)
ED RN visit- [facility] ED Rm 18- [facility]  Flo Shanks RN Pt to [facility] ED via EMS with severe dyspnea, weakness and functional decline.  Patient is a FULL CODE at home.  Patient seen at bedside, just back from xray. Patient with increased RR 32-34  and increased work of breathing. Writer spoke with attending physician Dr. Vanita Panda to advise that patient is followed by [facility], code status and current home medications. Also advocated for medication to be given for respiratory distress. Patient did recv 8mg  of IV morphine during visit. RR decreased to 28, patient appeared to be more relaxed. Per patient's home care RN patient is no longer taking any MS contin, MSIR or liquid morphine. He has had a slow functional decline over the past few weeks, needing to lean on the furniture to get around his home. Dr. Vanita Panda and writer both spoke with patient using the language line, patient continued to c/o difficulty with his swallowing "feeling like he was suffocating". Dr. Vanita Panda explained that his disease progression was causing these symptoms, that there was not anything to "cure" the disease. It was unclear as to whether patient truly understood the gravity of his cancer/prognosis. Patient was able to voice understanding and agreement with admission to the hospital. He remains a full code. [facility] will continue to follow.  Please call [phone] with any hospice needs Flo Shanks RN, BSN, [facility]  [facility]) H

## 2014-07-31 NOTE — ED Notes (Signed)
Patient states that he still cant give sample. RN made aware

## 2014-07-31 NOTE — ED Notes (Signed)
Notified patient again that we are needed a urine sample.

## 2014-07-31 NOTE — Progress Notes (Signed)
Clinical Social Work Department BRIEF PSYCHOSOCIAL ASSESSMENT 08/12/2014  Patient:  Russell Stanley, Russell Stanley     Account Number:  [id number]     Admit date:  08/09/2014  Clinical Social Worker:  Tilda Burrow, CLINICAL SOCIAL WORKER  Date/Time:  08/02/2014 12:00 M  Referred by:  CSW  Date Referred:  07/24/2014 Referred for  Other - See comment   Other Referral:   Patient currently has home hopsice. However, patient would benefit from being admitted into a facility Hospice.   Interview type:  Other - See comment Other interview type:    PSYCHOSOCIAL DATA Living Status:  OTHER Admitted from facility:   Level of care:  Assisted Living Primary support name:  Freddi Starr Primary support relationship to patient:  FAMILY Degree of support available:   Per facesheet, Patient has two contacts listed.    Relative/Teresa Maw [phone]  Friend/Rebecca Pittaed [phone]    CURRENT CONCERNS Current Concerns  Post-Acute Placement  Cultural/Religious Issues   Other Concerns:   Patient does not speak or understand english well.    SOCIAL WORK ASSESSMENT / PLAN Patient is a 49 year old male who presents to the ED with a cheif complaint of chest pain and generalized discomfort. Per note, patient has bad SOB for several days.    Per note, patient spoke with physican at bedside along with a language line. Patient does not understand or speak english well. Patient states that he is having difficulty swallowing and it feels as if he is suffocating.    Per note, Dr.Lockwood expalined that the sypmptoms he has been experiencing are due to the progression with disease. Due to language barriers it is unclear if patient truly understood the gravity of his cancer.    During this time patient receives home hosice. However, patient will greatly benefit from hospice at a facility. CSW will complete FL2 for patient in order to reach out to hospice facilities in [place].    Per note, please contact Flo Shanks regarding any hospice needs. [phone]. [facility] [place].   Assessment/plan status:  Information/Referral to [employer] Other assessment/ plan:   Information/referral to community resources:   CSW will reach out to hospice facilities in [place].    PATIENT'S/FAMILY'S RESPONSE TO PLAN OF CARE: Per note, Physician spoke with both patient and family using the interpreter line. It is unclear if patient understood the prognosis of his cancer due to language barriers.    Willette Brace [phone] ED CSW 07/24/2014 11:41 PM

## 2014-08-01 ENCOUNTER — Inpatient Hospital Stay (HOSPITAL_COMMUNITY)

## 2014-08-01 DIAGNOSIS — Z8614 Personal history of Methicillin resistant Staphylococcus aureus infection: Secondary | ICD-10-CM | POA: Diagnosis not present

## 2014-08-01 DIAGNOSIS — C7931 Secondary malignant neoplasm of brain: Secondary | ICD-10-CM | POA: Diagnosis not present

## 2014-08-01 DIAGNOSIS — B002 Herpesviral gingivostomatitis and pharyngotonsillitis: Secondary | ICD-10-CM | POA: Diagnosis not present

## 2014-08-01 DIAGNOSIS — R0600 Dyspnea, unspecified: Secondary | ICD-10-CM | POA: Diagnosis present

## 2014-08-01 DIAGNOSIS — C799 Secondary malignant neoplasm of unspecified site: Secondary | ICD-10-CM | POA: Diagnosis not present

## 2014-08-01 DIAGNOSIS — A419 Sepsis, unspecified organism: Secondary | ICD-10-CM | POA: Diagnosis present

## 2014-08-01 DIAGNOSIS — J189 Pneumonia, unspecified organism: Secondary | ICD-10-CM | POA: Diagnosis present

## 2014-08-01 DIAGNOSIS — C3490 Malignant neoplasm of unspecified part of unspecified bronchus or lung: Secondary | ICD-10-CM | POA: Diagnosis not present

## 2014-08-01 DIAGNOSIS — G893 Neoplasm related pain (acute) (chronic): Secondary | ICD-10-CM | POA: Diagnosis not present

## 2014-08-01 DIAGNOSIS — Z79899 Other long term (current) drug therapy: Secondary | ICD-10-CM | POA: Diagnosis not present

## 2014-08-01 DIAGNOSIS — J9601 Acute respiratory failure with hypoxia: Secondary | ICD-10-CM | POA: Diagnosis present

## 2014-08-01 DIAGNOSIS — Z515 Encounter for palliative care: Secondary | ICD-10-CM

## 2014-08-01 DIAGNOSIS — Z66 Do not resuscitate: Secondary | ICD-10-CM | POA: Diagnosis present

## 2014-08-01 DIAGNOSIS — E872 Acidosis: Secondary | ICD-10-CM | POA: Diagnosis not present

## 2014-08-01 DIAGNOSIS — B001 Herpesviral vesicular dermatitis: Secondary | ICD-10-CM | POA: Diagnosis present

## 2014-08-01 DIAGNOSIS — Y95 Nosocomial condition: Secondary | ICD-10-CM | POA: Diagnosis present

## 2014-08-01 LAB — URINALYSIS, ROUTINE W REFLEX MICROSCOPIC
Bilirubin Urine: NEGATIVE
GLUCOSE, UA: NEGATIVE mg/dL
Hgb urine dipstick: NEGATIVE
Ketones, ur: NEGATIVE mg/dL
Leukocytes, UA: NEGATIVE
Nitrite: NEGATIVE
Protein, ur: 30 mg/dL — AB
SPECIFIC GRAVITY, URINE: 1.027 (ref 1.005–1.030)
UROBILINOGEN UA: 1 mg/dL (ref 0.0–1.0)
pH: 5.5 (ref 5.0–8.0)

## 2014-08-01 LAB — MRSA PCR SCREENING: MRSA by PCR: NEGATIVE

## 2014-08-01 LAB — URINE MICROSCOPIC-ADD ON

## 2014-08-01 MED ORDER — SODIUM CHLORIDE 0.9 % IV SOLN
1.0000 mg/h | INTRAVENOUS | Status: DC
  Administered 2014-08-01 – 2014-08-03 (×3): 1 mg/h via INTRAVENOUS
  Administered 2014-08-04: 2 mg/h via INTRAVENOUS
  Administered 2014-08-04: 1.5 mg/h via INTRAVENOUS
  Filled 2014-08-01 (×4): qty 2.5

## 2014-08-01 MED ORDER — MORPHINE BOLUS VIA INFUSION
4.0000 mg | INTRAVENOUS | Status: DC | PRN
  Filled 2014-08-01: qty 8

## 2014-08-01 MED ORDER — LEVALBUTEROL HCL 0.63 MG/3ML IN NEBU: 0.6300 mg | INHALATION_SOLUTION | RESPIRATORY_TRACT | Status: DC | PRN

## 2014-08-01 MED ORDER — MAGIC MOUTHWASH W/LIDOCAINE
5.0000 mL | Freq: Four times a day (QID) | ORAL | Status: DC
  Administered 2014-08-01 – 2014-08-04 (×12): 5 mL via ORAL
  Filled 2014-08-01 (×16): qty 5

## 2014-08-01 MED ORDER — MORPHINE BOLUS VIA INFUSION
4.0000 mg | INTRAVENOUS | Status: DC | PRN
  Administered 2014-08-01 (×2): 4 mg via INTRAVENOUS
  Filled 2014-08-01 (×3): qty 8

## 2014-08-01 MED ORDER — PIPERACILLIN-TAZOBACTAM 3.375 G IVPB
3.3750 g | Freq: Three times a day (TID) | INTRAVENOUS | Status: DC
  Administered 2014-08-01 – 2014-08-03 (×5): 3.375 g via INTRAVENOUS
  Filled 2014-08-01 (×7): qty 50

## 2014-08-01 MED ORDER — HYDROCOD POLST-CHLORPHEN POLST 10-8 MG/5ML PO LQCR
5.0000 mL | Freq: Once | ORAL | Status: AC
  Administered 2014-08-01: 5 mL via ORAL
  Filled 2014-08-01: qty 5

## 2014-08-01 MED ORDER — DEXAMETHASONE SODIUM PHOSPHATE 4 MG/ML IJ SOLN
4.0000 mg | Freq: Four times a day (QID) | INTRAMUSCULAR | Status: DC
  Administered 2014-08-01 – 2014-08-03 (×8): 4 mg via INTRAVENOUS
  Filled 2014-08-01 (×12): qty 1

## 2014-08-01 MED ORDER — MORPHINE BOLUS VIA INFUSION
1.0000 mg | INTRAVENOUS | Status: DC | PRN
  Administered 2014-08-01: 2 mg via INTRAVENOUS
  Administered 2014-08-01: 5 mg via INTRAVENOUS
  Administered 2014-08-01: 1 mg via INTRAVENOUS
  Administered 2014-08-01: 2 mg via INTRAVENOUS
  Administered 2014-08-01: 1 mg via INTRAVENOUS
  Administered 2014-08-01: 2 mg via INTRAVENOUS
  Filled 2014-08-01 (×7): qty 2

## 2014-08-01 MED ORDER — HYDROMORPHONE HCL 1 MG/ML IJ SOLN
1.0000 mg | Freq: Once | INTRAMUSCULAR | Status: AC
  Administered 2014-08-01: 1 mg via INTRAVENOUS
  Filled 2014-08-01: qty 1

## 2014-08-01 MED ORDER — PIPERACILLIN-TAZOBACTAM 3.375 G IVPB 30 MIN
3.3750 g | Freq: Once | INTRAVENOUS | Status: AC
  Administered 2014-08-01: 3.375 g via INTRAVENOUS
  Filled 2014-08-01: qty 50

## 2014-08-01 MED ORDER — LORAZEPAM 2 MG/ML IJ SOLN
0.5000 mg | Freq: Once | INTRAMUSCULAR | Status: DC
Start: 2014-08-01 — End: 2014-08-01

## 2014-08-01 MED ORDER — VANCOMYCIN HCL IN DEXTROSE 750-5 MG/150ML-% IV SOLN
750.0000 mg | Freq: Three times a day (TID) | INTRAVENOUS | Status: DC
  Administered 2014-08-01 – 2014-08-03 (×5): 750 mg via INTRAVENOUS
  Filled 2014-08-01 (×6): qty 150

## 2014-08-01 MED ORDER — DEXTROSE 5 % IV SOLN
315.0000 mg | Freq: Three times a day (TID) | INTRAVENOUS | Status: AC
  Administered 2014-08-01 – 2014-08-03 (×6): 315 mg via INTRAVENOUS
  Filled 2014-08-01 (×6): qty 6.3

## 2014-08-01 MED ORDER — VANCOMYCIN HCL 10 G IV SOLR
1500.0000 mg | Freq: Once | INTRAVENOUS | Status: AC
  Administered 2014-08-01: 1500 mg via INTRAVENOUS
  Filled 2014-08-01: qty 1500

## 2014-08-01 MED ORDER — LORAZEPAM 2 MG/ML IJ SOLN
0.5000 mg | INTRAMUSCULAR | Status: DC | PRN
  Administered 2014-08-01: 0.5 mg via INTRAVENOUS
  Filled 2014-08-01 (×2): qty 1

## 2014-08-01 MED ORDER — DEXTROSE 5 % IV SOLN
315.0000 mg | Freq: Once | INTRAVENOUS | Status: AC
  Administered 2014-08-01: 315 mg via INTRAVENOUS
  Filled 2014-08-01: qty 6.3

## 2014-08-01 MED ORDER — LEVALBUTEROL HCL 0.63 MG/3ML IN NEBU
0.6300 mg | INHALATION_SOLUTION | Freq: Four times a day (QID) | RESPIRATORY_TRACT | Status: DC
  Administered 2014-08-01 – 2014-08-04 (×12): 0.63 mg via RESPIRATORY_TRACT
  Filled 2014-08-01 (×13): qty 3

## 2014-08-01 MED ORDER — HYDROMORPHONE BOLUS VIA INFUSION
1.0000 mg | INTRAVENOUS | Status: DC | PRN
  Administered 2014-08-01 – 2014-08-04 (×5): 1 mg via INTRAVENOUS
  Filled 2014-08-01 (×6): qty 1

## 2014-08-01 MED ORDER — KETOROLAC TROMETHAMINE 30 MG/ML IJ SOLN
30.0000 mg | Freq: Four times a day (QID) | INTRAMUSCULAR | Status: DC | PRN
  Administered 2014-08-01: 30 mg via INTRAVENOUS
  Filled 2014-08-01: qty 1

## 2014-08-01 NOTE — Plan of Care (Addendum)
Problem: Phase I Progression Outcomes Goal: Pain controlled with appropriate interventions Outcome: Progressing Paged NP this am to get order for boluses of morphine for pain and one dose of Tussinox. Gtt continues at 1 mg/h

## 2014-08-01 NOTE — Progress Notes (Addendum)
ANTIBIOTIC CONSULT NOTE - INITIAL  Pharmacy Consult for acyclovir Indication: mucutaneous HSV  No Known Allergies  Patient Measurements: Weight: 138 lb 10.7 oz (62.9 kg) Ideal Body Weight: 61.5kg  Vital Signs: Temp: 98.3 F (36.8 C) (01/13 0654) Temp Source: Oral (01/13 0654) BP: 128/98 mmHg (01/13 0654) Pulse Rate: 132 (01/13 0654) Intake/Output from previous day: 01/12 0701 - 01/13 0700 In: 440 [P.O.:360] Out: 500 [Urine:500] Intake/Output from this shift: Total I/O In: 160 [Other:160] Out: -   Labs:  Recent Labs  08/08/2014 1341  WBC 27.0*  HGB 11.4*  PLT 111*  CREATININE 0.72   Estimated Creatinine Clearance: 95 mL/min (by C-G formula based on Cr of 0.72).   Microbiology: Recent Results (from the past 720 hour(s))  MRSA PCR Screening     Status: None   Collection Time: 08/05/2014 11:50 PM  Result Value Ref Range Status   MRSA by PCR NEGATIVE NEGATIVE Final    Comment:        The GeneXpert MRSA Assay (FDA approved for NASAL specimens only), is one component of a comprehensive MRSA colonization surveillance program. It is not intended to diagnose MRSA infection nor to guide or monitor treatment for MRSA infections.     Medical History: Past Medical History  Diagnosis Date  . 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 Reflux   . Primary lung cancer with metastasis from lung to other site 05/11/2014    Medications:  Scheduled:  . magic mouthwash w/lidocaine  5 mL Oral QID  . scopolamine  1 patch Transdermal Q72H   Infusions:  . morphine 1 mg/hr (08/17/2014 1949)   Assessment: 69 yoM admitted 1/12 with increasing ShOB, CP, nausea. Pt with recent admission 11/30-12/4 for MRSA thumb abscess (recenlty received vancomycin, Zosyn, bactrim, and oritavancin). Noted patient with metastatic lung cancer currently on home hospice. Pt in a lot of pain this admission and MD notes suspected herpetic lesions in patient's mouth. Pharmacy is consulted to dose IV acyclovir for  mucutaneous HSV.  Antiinfectives  1/13 >> acyclovir >>  Labs / vitals Tmax: afebrile WBCs: elevated to 27k Renal: SCr 0.72 (baseline appears ~0.5), CrCl 95 ml/min CG  Previous microbiology 11/30 R thumb abscess : abundant MRSA  No cultures this admission   Goal of Therapy:  acyclovir per indication and renal function  Plan:  - acyclovir 315mg  IV q8h for 7 days of therapy  - using 5mg /kg per dose of actual body weight for dosing - follow-up clinical course, culture results, renal function  Thank you for the consult.  Currie Paris, PharmD, BCPS Pager: [phone] Pharmacy: [phone] 08/01/2014 11:26 AM   Addendum: CXR performed 1/13 showed worsening airspace disease and Pharmacy is consulted to dose vancomycin and Zosyn for HCAP  Plan: - vancomycin 1500mg  IV x1 as a loading dose - vancomycin 750mg  IV q8h to start 1/14 at midnight - Zosyn 3.375G IV q8h, each dose to be infused over 4 hours - vancomycin trough at steady state if indicated - follow-up clinical course, culture results, renal function - follow-up antibiotic de-escalation and length of therapy  Thank you for the consult.  Currie Paris, PharmD, BCPS Pager: [phone] Pharmacy: [phone] 08/01/2014 2:25 PM

## 2014-08-01 NOTE — Progress Notes (Signed)
Patient is awake. Verbalized the his pain is 4/10. On Dilaudid drip at 1mg /hr. Willa continue to monitor the patient.- Sandie Ano RN

## 2014-08-01 NOTE — Progress Notes (Signed)
[facility]) Home Care Chaplain Note  Patient: Russell Stanley  OFPU:[id number]  [place] visited to provide spiritual support.  Pt in distress on entry into the room, using diaphragm and abdominal muscles to help breathing.  Chaplain attempted to reach a [place] interpreter on [employer], but none of the interpreters were available.  Hospice RN and SW came in a few minutes later.  Chaplain communicated with pt through single English words and simple hand signs and he responded with nods and shaking of his head to signal his understanding.  When a language line interpreter was available chaplain had prayer with pt in accordance with his tradition.  Chaplain provided pastoral presence and prayer with blessing.  Chaplain will continue to provide ongoing spiritual support and will follow.  Beverly Isley-Landreth ThM, [place] Clinical Chaplain

## 2014-08-01 NOTE — Progress Notes (Signed)
CSW continuing to follow.  Pt admitted from Home with Hospice and [place].   CSW spoke with [facility] SW, Trucia Truesdale and [facility] RN, Flo Shanks who met with pt at bedside and discussed that they have scheduled appointment with pt wife this afternoon at 4 pm to discuss pt status and disposition.   [facility] SW, Lesle Reek will update CSW following family meeting.  CSW to continue to follow to provide support and assist with disposition as appropriate.  Alison Murray, MSW, [place] Work [phone]

## 2014-08-01 NOTE — Progress Notes (Signed)
RM [id number] [place] MSW Note:  Joint RN Ignacia PalmaRise Paganini, SW-Trucia visit. Attempted through the language line during the visit to explain to spouse over the phone that pt's px is poor. Scheduled appt. for this afternoon at 4pm with pt's spouse and the family church friend ([place]) Wells Guiles to discuss pt's status and disposition. Benedict Needy [place],LCSW

## 2014-08-01 NOTE — Progress Notes (Signed)
Inpatient RN visit- [place] [facility] RN Visit-Karen Alford Highland RN  Related admission to [facility] diagnosis of Lung Ca w/mets.   Pt is now DNR  code.   Pt seen at bedside, [facility] Chaplain and LCSW present during visit. Patient with s/s of respiratory distress, use of abdominal muscles and stridor noted. Pt was started on a morphine gtt last evening at 82mhg/hr, now up to 2 mg/hr, he has required 13mg  total of boluses. Writer spoke with Attending physician Dr. Rockne Menghini who was present on the floor, gtt increased to 3mg /hr with orders for titration up to 10mg /hr for management of pain/respiratory distress. Patient also now has orders for PRN IV ativan Q 4 hrs for anxiety, patient recvd a dose during RN visit. Patient appeared sleepy after PRN ativan, stridor still present, staff RN Butch Penny contacted Respiratory therapy and patient was given a neb tx. During visit [facility] LCSWTrucia, using the language line set up an appointment with patient's family, family fiend, attending MD and herself for 4pm today to discuss the gravity of patient's illness. Per chart notes, patient has also been started on IV acyclovir for treatment of oral herpetic lesions. [facility] will continue to follow.  Patient's home medication list and transfer summary in place  on shadow chart.  Please call [facility] @ [phone]-  with any hospice needs.   Thank you. Flo Shanks, RN, BSN, [place] Liaison  [phone])

## 2014-08-01 NOTE — Progress Notes (Signed)
Morphine drip wasted in the sink,amounting to 210 cc,witnessed by Jama Flavors. RN

## 2014-08-01 NOTE — Plan of Care (Signed)
Problem: Phase I Progression Outcomes Goal: Voiding-avoid urinary catheter unless indicated Outcome: Completed/Met Date Met:  08/01/14 Patient voided 500 ml of urine into urinal.

## 2014-08-01 NOTE — Progress Notes (Signed)
Progress Note   Russell Stanley QTM:[id number] DOB: [birth year] DOA: 08/09/2014 PCP: No PCP Per Patient   Brief Narrative:   Russell Stanley is an 49 y.o. male with a PMH of stage IV lung cancer with brain metastasis, status post palliative radiation therapy, TB exposure, MRSA hand abscess recently discharged from [facility] 06/25/14 for treatment of this, who was admitted 08/19/2014 with increased work of breathing, chest pain and nausea. He is on home hospice care.  Assessment/Plan:   Principal Problem:   Acute respiratory failure with hypoxia   Multifactorial with progressive lung cancer, postobstructive healthcare pneumonia.  Family meeting scheduled at 4 PM, would treat underlying conditions aggressively for now.  Repeat chest x-ray today.  Continue supplemental oxygen, titrate to maintain oxygen saturations greater than 90%.  Active Problems:   Brain metastases  Status post palliative radiation therapy.  Resume Decadron.    Primary lung cancer with metastasis from lung to other site  Poor prognosis, focus on comfort, but we'll continue to treat aggressively until I am able to meet with him and his family to ascertain his wishes.    Tachycardia  Compensatory, related to hypoxia and tachypnea.    HCAP (healthcare-associated pneumonia)  Start vancomycin and Zosyn for postobstructive pneumonia.    Oral herpes simplex infection  Start acyclovir.    DVT Prophylaxis  Comfort care.  Code Status: Full. Family Communication: Family meeting scheduled for 4 p.m. Disposition Plan: Home when stable.   IV Access:    Peripheral IV   Procedures and diagnostic studies:   Dg Chest 2 View 08/06/2014: 1. Innumerable bilateral pulmonary nodules consistent with known metastatic disease again noted. 2. Persistent consolidation left upper lobe. Persistent cystic changes left lung base. 3. Stable cardiomegaly.  No CHF.     Medical Consultants:    Palliative Care  Anti-Infectives:     Vancomycin 08/01/14--->  Zosyn 08/01/14--->  Subjective:   Russell Stanley is having significant pain. He is groaning out with tachypnea and tachycardia. Unfortunately, he speaks a very specific dialect of the [place] language, and even using a national interpreter line, no interpreter is currently available. No family is currently at the bedside. A family meeting has been scheduled with an interpreter for 4 PM today.  Objective:    Filed Vitals:   08/08/2014 1945 08/01/14 0654 08/01/14 1214 08/01/14 1351  BP:  128/98  127/78  Pulse: 126 132  154  Temp:  98.3 F (36.8 C)    TempSrc:  Oral    Resp:  24  35  Weight:      SpO2:  97% 93%     Intake/Output Summary (Last 24 hours) at 08/01/14 1355 Last data filed at 08/01/14 1122  Gross per 24 hour  Intake    600 ml  Output    750 ml  Net   -150 ml    Exam: Gen:  NAD, restless, uncomfortable. Cardiovascular: Tachy, No M/R/G Respiratory:  Lungs with bilateral rhonchi/rub, tachypneic. Gastrointestinal:  Abdomen soft, NT/ND, + BS Extremities:  + Edema   Data Reviewed:    Labs: Basic Metabolic Panel:  Recent Labs Lab 07/30/2014 1341  NA 135  K 4.1  CL 101  CO2 18*  GLUCOSE 150*  BUN 23  CREATININE 0.72  CALCIUM 8.6   GFR Estimated Creatinine Clearance: 95 mL/min (by C-G formula based on Cr of 0.72). Liver Function Tests:  Recent Labs Lab 08/13/2014 1341  AST 45*  ALT 21  ALKPHOS 630*  BILITOT 1.1  PROT 8.3  ALBUMIN 3.5    Recent Labs Lab 07/26/2014 1341  LIPASE 27   CBC:  Recent Labs Lab 08/19/2014 1341  WBC 27.0*  NEUTROABS 23.5*  HGB 11.4*  HCT 34.7*  MCV 89.9  PLT 111*   Cardiac Enzymes:  Recent Labs Lab 08/10/2014 1341  TROPONINI 0.19*   Sepsis Labs:  Recent Labs Lab 08/15/2014 1341 08/02/2014 1403  WBC 27.0*  --   LATICACIDVEN  --  4.74*   Microbiology Recent Results (from the past 240 hour(s))  MRSA PCR Screening     Status: None   Collection Time: 08/13/2014 11:50 PM  Result Value  Ref Range Status   MRSA by PCR NEGATIVE NEGATIVE Final    Comment:        The GeneXpert MRSA Assay (FDA approved for NASAL specimens only), is one component of a comprehensive MRSA colonization surveillance program. It is not intended to diagnose MRSA infection nor to guide or monitor treatment for MRSA infections.      Medications:   . acyclovir  315 mg Intravenous Q8H  . dexamethasone  4 mg Intravenous Q6H  . levalbuterol  0.63 mg Nebulization Q6H  . magic mouthwash w/lidocaine  5 mL Oral QID  . scopolamine  1 patch Transdermal Q72H   Continuous Infusions: . morphine 5 mg/hr (08/01/14 1351)    Time spent: 35 minutes.  The patient is medically complex and requires high complexity decision making with multiple reassessments and coordination of care with hospice, palliative care.    LOS: 1 day   RAMA,CHRISTINA  [facility] Pager [phone]. If unable to reach me by pager, please call my cell phone at [phone].  *Please refer to [email or web address], password [email or web address] to get updated schedule on who will round on this patient, as hospitalists switch teams weekly. If 7PM-7AM, please contact night-coverage at [email or web address], password [email or web address] for any overnight needs.  08/01/2014, 1:55 PM

## 2014-08-01 NOTE — Consult Note (Signed)
Palliative Medicine Team at [facility]  Date: 08/01/2014   Patient Name: Russell Stanley  DOB: Oct 14, 1965  MRN: [id number]  Age / Sex: 49 y.o., male   PCP: No Pcp Per Patient Referring Physician: Venetia Maxon Rama, MD  Active Problems: Principal Problem:   Acute respiratory failure with hypoxia Active Problems:   Brain metastases   Primary lung cancer with metastasis from lung to other site   Tachycardia   Acute respiratory failure with hypoxemia   HCAP (healthcare-associated pneumonia)   Oral herpes simplex infection   HPI/Reason for Consultation: Russell Stanley is a 49 y.o. [place] gentleman with metastatic lung cancer currently on hospice services at home. PMT was called by primary service secondary to extremely difficult to control dyspnea and distress. He had received over 13 boluses of IV morphine since admission last night without significant relief. Significant language and cultural barriers and difficult psycho-social issues.   Participants in Discussion: HCPOA: no Very difficult to determine who members of the family were in the room and who members of their supporting [place] interpretor phone was extremely challenging- patient and family simply could not focus on any of my questions.   Advance Directive: no   Code Status Orders        Start     Ordered   07/23/2014 1802  Do not attempt resuscitation (DNR)   Continuous    Question Answer Comment  In the event of cardiac or respiratory ARREST Do not call a "code blue"   In the event of cardiac or respiratory ARREST Do not perform Intubation, CPR, defibrillation or ACLS   In the event of cardiac or respiratory ARREST Use medication by any route, position, wound care, and other measures to relive pain and suffering. May use oxygen, suction and manual treatment of airway obstruction as needed for comfort.      08/14/2014 1801      I have reviewed the medical record, interviewed the patient and family, and examined the patient.  The following aspects are pertinent.  Past Medical History  Diagnosis Date  . 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 Reflux   . Primary lung cancer with metastasis from lung to other site 05/11/2014   History   Social History  . Marital Status: Married    Spouse Name: N/A    Number of Children: N/A  . Years of Education: N/A   Social History Main Topics  . Smoking status: Never Smoker   . Smokeless tobacco: Current User    Types: Chew  . Alcohol Use: No  . Drug Use: No  . Sexual Activity: None   Other Topics Concern  . None   Social History Narrative   Family History  Problem Relation Age of Onset  . Cancer      Father   Scheduled Meds: . acyclovir  315 mg Intravenous Q8H  . dexamethasone  4 mg Intravenous Q6H  . levalbuterol  0.63 mg Nebulization Q6H  . magic mouthwash w/lidocaine  5 mL Oral QID  . piperacillin-tazobactam (ZOSYN)  IV  3.375 g Intravenous Q8H  . scopolamine  1 patch Transdermal Q72H  . vancomycin  1,500 mg Intravenous Once  . [START ON 08/02/2014] vancomycin  750 mg Intravenous Q8H   Continuous Infusions: . HYDROmorphone 1 mg/hr (08/01/14 1437)   PRN Meds:.haloperidol, HYDROmorphone, ketorolac, levalbuterol, LORazepam No Known Allergies CBC:    Component Value Date/Time   WBC 27.0* 07/30/2014 1341   HGB 11.4* 07/20/2014 1341   HCT 34.7* 08/05/2014 1341  PLT 111* 08/15/2014 1341   MCV 89.9 08/07/2014 1341   NEUTROABS 23.5* 08/16/2014 1341   LYMPHSABS 2.1 08/12/2014 1341   MONOABS 1.3* 08/07/2014 1341   EOSABS 0.0 07/20/2014 1341   BASOSABS 0.0 08/18/2014 1341   Comprehensive Metabolic Panel:    Component Value Date/Time   NA 135 08/17/2014 1341   K 4.1 08/16/2014 1341   CL 101 07/23/2014 1341   CO2 18* 08/19/2014 1341   BUN 23 08/08/2014 1341   CREATININE 0.72 07/24/2014 1341   GLUCOSE 150* 08/08/2014 1341   CALCIUM 8.6 07/23/2014 1341   AST 45* 08/11/2014 1341   ALT 21 08/14/2014 1341   ALKPHOS 630* 08/14/2014 1341   BILITOT 1.1  08/03/2014 1341   PROT 8.3 08/06/2014 1341   ALBUMIN 3.5 08/09/2014 1341    Vital Signs: BP 127/78 mmHg  Pulse 154  Temp(Src) 98.3 F (36.8 C) (Oral)  Resp 35  Wt 62.9 kg (138 lb 10.7 oz)  SpO2 93% Filed Weights   08/15/2014 1942  Weight: 62.9 kg (138 lb 10.7 oz)   01/12 0701 - 01/13 0700 In: 440 [P.O.:360] Out: 500 [Urine:500]  Physical Exam:  Alert, eating food from home ravenously, + tachypnea, looks anxious but distress has improved with initiation of dilaudid infusion. Nasal cannula O2.   Summary of Established Goals of Care and Medical Treatment Preferences  Primary Diagnoses  1. Lung Cancer, PNA  Active Symptoms: 1. Dyspnea-severe 2. Anxiety 3. Pain  Psycho-social/Spiritual: Difficult to fully determine- hospice CSW present and following.  Prognosis: Days-may stabilize to weeks or have rapid decline at any point. Difficult to determine- earlier today he was in significant distress and death was felt to be immanent, however after shift to aggressive symptom management/comfort care with dilaudid infusion he has improved.   Palliative Performance Scale: 40%  Recommendations:  1. Code Status: DNR 2. Scope of Treatment:   All medically reasonable interventions for reversible illness  Focus on comfort, pain and dyspnea control 3. Symptom Management:  1. Maxed out on morphine and was on over 60mg  daily oral at home-recommended to start Dilaudid infusion and to titrate for comfort. Bolus IV dilaudid for breakthrough symptoms. 4. Palliative Prophylaxis: +scop patch, decadron, ativan, haldol 5. Disposition: per [facility] team   Time : 4:30- 5:10  Time Total: 40 min Greater than 50%  of this time was spent counseling and coordinating care related to the above assessment and plan.  Signed by: Roma Schanz, DO  08/01/2014, 4:55 PM  Please contact Palliative Medicine Team phone at [phone] for questions and concerns.

## 2014-08-01 NOTE — Progress Notes (Addendum)
Received report from ED.  

## 2014-08-01 NOTE — Progress Notes (Signed)
Dialudid drip initiated at around 1445, rate at 1mg /hr. - Sandie Ano RN

## 2014-08-01 NOTE — Progress Notes (Signed)
Nutrition Brief Note  Patient identified as positive Malnutrition Screening Tool Chart reviewed. Pt now transitioning to comfort care.  No nutrition interventions warranted at this time.  Please consult as needed.   Atlee Abide MS RD LDN Clinical Dietitian [phone]

## 2014-08-01 NOTE — Progress Notes (Signed)
Patient is calm,eyes closed,. no moaning noted, sweaty,RR down to 21. On dialudid drip at 1mg /hr. Will continue to monitor the patient- Sandie Ano RN

## 2014-08-01 NOTE — Progress Notes (Signed)
Patient voided 250cc in the urinal at  1125 this am. Asked him to try to void at approximately 1545,but unable to urinate.Bladder scan performed,result was 0.Condom cath placed for patient's comfort. MD aware re; this matter. Will continue to monitor the patient.- Sandie Ano RN

## 2014-08-01 NOTE — Progress Notes (Signed)
Patient admitted to room 1334 from ED for symptom management for end of life care. To start morphine gtt.

## 2014-08-01 NOTE — Plan of Care (Signed)
Problem: Phase I Progression Outcomes Goal: Respirations unlabored Outcome: Not Met (add Reason) Patient gets SOB,on O2 at 3L per nasal cannula

## 2014-08-01 NOTE — Progress Notes (Signed)
CSW has completed FL2 and faxed patient out to surrounding hospice facilities in [place].  Willette Brace [phone] ED CSW 08/01/2014 12:18 AM

## 2014-08-02 DIAGNOSIS — R0989 Other specified symptoms and signs involving the circulatory and respiratory systems: Secondary | ICD-10-CM | POA: Diagnosis present

## 2014-08-02 DIAGNOSIS — C799 Secondary malignant neoplasm of unspecified site: Secondary | ICD-10-CM

## 2014-08-02 DIAGNOSIS — E872 Acidosis: Secondary | ICD-10-CM

## 2014-08-02 DIAGNOSIS — A419 Sepsis, unspecified organism: Secondary | ICD-10-CM | POA: Diagnosis present

## 2014-08-02 DIAGNOSIS — G893 Neoplasm related pain (acute) (chronic): Secondary | ICD-10-CM | POA: Diagnosis present

## 2014-08-02 DIAGNOSIS — J9601 Acute respiratory failure with hypoxia: Secondary | ICD-10-CM

## 2014-08-02 DIAGNOSIS — C7931 Secondary malignant neoplasm of brain: Secondary | ICD-10-CM

## 2014-08-02 DIAGNOSIS — J189 Pneumonia, unspecified organism: Secondary | ICD-10-CM

## 2014-08-02 DIAGNOSIS — B002 Herpesviral gingivostomatitis and pharyngotonsillitis: Secondary | ICD-10-CM

## 2014-08-02 DIAGNOSIS — C3490 Malignant neoplasm of unspecified part of unspecified bronchus or lung: Secondary | ICD-10-CM

## 2014-08-02 NOTE — Progress Notes (Addendum)
RM [place] MSW Note:  Joint visit with RN liaison [place] used. Spouse was present. Pt appeared much improved from yesterday. Pt, himself, reported feeling much improved from yesterday. Spouse expressed that the reason she believes he has improved is due to eating. Pt expressed concerns about blood in his foley catheter bag, which RN addressed. Pt also expressed concerns about not being able to communicate with the nurses in and out of his room. He stated that there are things he would like to for staff to know but he is unable to tell them. He has requested that an interpreter or the language line be used to communicate with him. SW and RN Santiago Glad have informed unit staff of the request.  Payton Mccallum, LCSW

## 2014-08-02 NOTE — Progress Notes (Signed)
Patient only needed 1 bolus of dilaudid last night. Dilaudid gtt going at 0.5 mg/h or 1 ml/h.

## 2014-08-02 NOTE — Progress Notes (Addendum)
Inpatient RN visit- Russell [facility] of [facility] RN Visit-Russell Alford Highland RN Related admission to [facility] diagnosis of Lung Ca. Pt is DNR code this hospitalization.  Pt alert & oriented, sitting up in bed. Visit made with [facility] LCSW Russell Stanley. Language line used through out visit. Patient's wife also present in the room with his infant daughter. Patient reports feeling better, he feels his breathing is better, reports his lips are still dry, but his mouth sores are less painful today. He has lip balm at his bedside, writer encouraged him to use it. He still appears to be using accessory muscles, and became more short of breath with conversation. He expressed concern, through the interpreter about his foley draining "red" instead of yellow. Writer reassured him that this should clear up, and to continue to drink fluids. He reports his swallowing has improved. Foley placed this am for retention.  He remains on a continuous dilaudid gtt at 1mg /hr for management of dyspnea/pain, changed from continuous morphine on 1/13. He is also recvg  IV abt and IV acyclovir.  Patient has requested that hospital staff use an interpreter/ language line when interacting with him. [facility] will continue to follow.  Patient's home medication list and transfer summary in place on shadow chart.  Please call [facility] @ [phone]-  with any hospice needs.   Thank you. Russell Harries, RN  [facility]  Hospice Liaison  [phone])

## 2014-08-02 NOTE — Progress Notes (Addendum)
Rm Drake                       MSW Note:  Family conference held [place]. Dr. Rockne Menghini, pt's spouse, 49yo dtr, family friends, [place] friends and this SW were present for the meeting . Discussed pt's current declined status, poor px and disposition. Pt slept through the meeting. MD explained to spouse that there are no interventions available to "improve" pt's cancer and there are only interventions that will keep pt comfortable. She also informed that given pt's poor px, he will remain in the hospital until his death as his death may occur in a matter of days. All in agreement that pt should remain in the hospital for EOL care vs being moved. Family and friends expressed their understanding of the information. Spouse desired to know if pt can have a burial not a cremation. Explained to spouse that the church is unable to assist with a burial and hospice has already agreed to cover the cost of cremation (through [employer]) but neither will be able to cover the cost of a funeral.Trucia Truesdale

## 2014-08-02 NOTE — Progress Notes (Signed)
Progress Note   Russell Stanley WIO:[id number] DOB: 12-17-1965 DOA: 08/07/2014 PCP: No PCP Per Patient   Brief Narrative:   Russell Stanley is an 49 y.o. male with a PMH of stage IV lung cancer with brain metastasis, status post palliative radiation therapy, TB exposure, MRSA hand abscess recently discharged from [facility] 06/25/14 for treatment of this, who was admitted 08/06/2014 with increased work of breathing, chest pain and nausea. He is on home hospice care.  Assessment/Plan:   Principal Problem:   Acute respiratory failure with hypoxia   Multifactorial with progressive lung cancer, postobstructive healthcare pneumonia.  Family meeting held 08/01/14. Wife understands that his condition is terminal.  Repeat chest x-ray done 08/01/14 in light of significant respiratory distress which showed worsening airspace disease. Antibiotics were started and his respiratory status has stabilized somewhat.  Continue supplemental oxygen, titrate to maintain oxygen saturations greater than 90%.  Active Problems:   Brain metastases  Status post palliative radiation therapy.  Continue Decadron.    Primary lung cancer with metastasis from lung to other site with cancer associated pain and air hunger  Poor prognosis, focus on comfort, but patient and his family do wish to have treatable illnesses treated.  Pain and air hunger currently being managed with dilaudid at 0.5 mg/ hour.    Tachycardia  Compensatory, related to hypoxia and tachypnea.    Sepsis secondary to HCAP (healthcare-associated pneumonia)  Continue empiric vancomycin and Zosyn for postobstructive pneumonia.    Oral herpes simplex infection  Continue acyclovir.    DVT Prophylaxis  Comfort care.  Code Status: Full. Family Communication: Family updated 08/01/14 with an interpreter Disposition Plan: Likely to experience an in-hospital death.   IV Access:    Peripheral IV   Procedures and diagnostic studies:   Dg Chest 2 View  08/13/2014: 1. Innumerable bilateral pulmonary nodules consistent with known metastatic disease again noted. 2. Persistent consolidation left upper lobe. Persistent cystic changes left lung base. 3. Stable cardiomegaly.  No CHF.     Dg Chest Port 1 View 08/01/2014: Diffuse pulmonary metastases.  Continued left upper lobe opacification.  Worsening airspace disease in the right upper lobe could reflect pneumonia or worsening metastatic disease.     Medical Consultants:    Dr. Lane Hacker, Palliative Care  Anti-Infectives:    Vancomycin 08/01/14--->  Zosyn 08/01/14--->  Subjective:   Russell Stanley is a lot more comfortable today.  He indicates that his pain has improved and his breathing is less labored.  Maintaining oxygen saturations on nasal cannula.  Objective:    Filed Vitals:   08/01/14 2104 08/01/14 2112 08/02/14 0156 08/02/14 0420  BP:    120/101  Pulse:    130  Temp:    96.7 F (35.9 C)  TempSrc:    Oral  Resp:    28  Weight:      SpO2: 90% 92% 92% 95%    Intake/Output Summary (Last 24 hours) at 08/02/14 0729 Last data filed at 08/01/14 1600  Gross per 24 hour  Intake    210 ml  Output    250 ml  Net    -40 ml    Exam: Gen:  NAD Cardiovascular: Tachy, No M/R/G Respiratory:  Lungs with bilateral rhonchi/rub/wheeze, tachypnea improved. Gastrointestinal:  Abdomen soft, NT/ND, + BS Extremities:  + Edema   Data Reviewed:    Labs: Basic Metabolic Panel:  Recent Labs Lab 07/26/2014 1341  NA 135  K 4.1  CL 101  CO2 18*  GLUCOSE 150*  BUN 23  CREATININE 0.72  CALCIUM 8.6   GFR Estimated Creatinine Clearance: 95 mL/min (by C-G formula based on Cr of 0.72). Liver Function Tests:  Recent Labs Lab 07/20/2014 1341  AST 45*  ALT 21  ALKPHOS 630*  BILITOT 1.1  PROT 8.3  ALBUMIN 3.5    Recent Labs Lab 08/07/2014 1341  LIPASE 27   CBC:  Recent Labs Lab 08/17/2014 1341  WBC 27.0*  NEUTROABS 23.5*  HGB 11.4*  HCT 34.7*  MCV 89.9  PLT 111*    Cardiac Enzymes:  Recent Labs Lab 07/28/2014 1341  TROPONINI 0.19*   Sepsis Labs:  Recent Labs Lab 08/09/2014 1341 07/22/2014 1403  WBC 27.0*  --   LATICACIDVEN  --  4.74*   Microbiology Recent Results (from the past 240 hour(s))  MRSA PCR Screening     Status: None   Collection Time: 08/07/2014 11:50 PM  Result Value Ref Range Status   MRSA by PCR NEGATIVE NEGATIVE Final    Comment:        The GeneXpert MRSA Assay (FDA approved for NASAL specimens only), is one component of a comprehensive MRSA colonization surveillance program. It is not intended to diagnose MRSA infection nor to guide or monitor treatment for MRSA infections.      Medications:   . acyclovir  315 mg Intravenous Q8H  . dexamethasone  4 mg Intravenous Q6H  . levalbuterol  0.63 mg Nebulization Q6H  . magic mouthwash w/lidocaine  5 mL Oral QID  . piperacillin-tazobactam (ZOSYN)  IV  3.375 g Intravenous Q8H  . scopolamine  1 patch Transdermal Q72H  . vancomycin  750 mg Intravenous Q8H   Continuous Infusions: . HYDROmorphone 1 mg/hr (08/01/14 1437)    Time spent: 35 minutes.  The patient is medically complex and requires high complexity decision making with coordination of care with hospice, palliative care.    LOS: 2 days   RAMA,CHRISTINA  [facility] Pager [phone]. If unable to reach me by pager, please call my cell phone at [phone].  *Please refer to [email or web address], password [email or web address] to get updated schedule on who will round on this patient, as hospitalists switch teams weekly. If 7PM-7AM, please contact night-coverage at [email or web address], password [email or web address] for any overnight needs.  08/02/2014, 7:29 AM

## 2014-08-03 LAB — BASIC METABOLIC PANEL
Anion gap: 11 (ref 5–15)
BUN: 18 mg/dL (ref 6–23)
CO2: 23 mmol/L (ref 19–32)
Calcium: 8.9 mg/dL (ref 8.4–10.5)
Chloride: 99 mEq/L (ref 96–112)
Creatinine, Ser: 0.55 mg/dL (ref 0.50–1.35)
GFR calc non Af Amer: >90 mL/min (ref >90)
Glucose, Bld: 174 mg/dL — ABNORMAL HIGH (ref 70–99)
POTASSIUM: 4 mmol/L (ref 3.5–5.1)
SODIUM: 133 mmol/L — AB (ref 135–145)

## 2014-08-03 LAB — CBC
HEMATOCRIT: 27.4 % — AB (ref 39.0–52.0)
Hemoglobin: 8.7 g/dL — ABNORMAL LOW (ref 13.0–17.0)
MCH: 28 pg (ref 26.0–34.0)
MCHC: 31.8 g/dL (ref 30.0–36.0)
MCV: 88.1 fL (ref 78.0–100.0)
Platelets: 108 10*3/uL — ABNORMAL LOW (ref 150–400)
RBC: 3.11 MIL/uL — AB (ref 4.22–5.81)
RDW: 16.8 % — ABNORMAL HIGH (ref 11.5–15.5)
WBC: 21 10*3/uL — ABNORMAL HIGH (ref 4.0–10.5)

## 2014-08-03 MED ORDER — VALACYCLOVIR HCL 500 MG PO TABS
1000.0000 mg | ORAL_TABLET | Freq: Two times a day (BID) | ORAL | Status: DC
  Administered 2014-08-03 – 2014-08-04 (×2): 1000 mg via ORAL
  Filled 2014-08-03 (×4): qty 2

## 2014-08-03 MED ORDER — DOXYCYCLINE HYCLATE 100 MG PO TABS
100.0000 mg | ORAL_TABLET | Freq: Two times a day (BID) | ORAL | Status: DC
  Administered 2014-08-03 – 2014-08-04 (×3): 100 mg via ORAL
  Filled 2014-08-03 (×4): qty 1

## 2014-08-03 MED ORDER — SENNA 8.6 MG PO TABS
1.0000 | ORAL_TABLET | Freq: Every day | ORAL | Status: DC
  Administered 2014-08-03: 8.6 mg via ORAL
  Filled 2014-08-03: qty 1

## 2014-08-03 MED ORDER — DEXAMETHASONE 4 MG PO TABS
4.0000 mg | ORAL_TABLET | Freq: Four times a day (QID) | ORAL | Status: DC
  Administered 2014-08-03 – 2014-08-04 (×4): 4 mg via ORAL
  Filled 2014-08-03 (×7): qty 1

## 2014-08-03 NOTE — Progress Notes (Addendum)
Inpatient RN visit-Kayleb Courtland of [facility] RN Visit-Karen Alford Highland RN  Related admission to [facility] diagnosis of Lung Ca w/mets. Pt is DNR this admission  code.    Pt alert & oriented, sitting up in bed. Visit made with attending Dr. Rockne Menghini and [facility] LCSW Benedict Needy. Language line used for the duration of the visit.   Patient reports he is feeling weak and gets short of breath with any movement, he continues with some use of accessory muscles after conversation, he also remains sitting upright. Per chart notes and discussion with staff RN Legrand Rams the dilaudid gtt was increased from 0.5mg /hr  to 1mg /hr yesterday afternoon for increased work of breathing. Attending physician, through the interpreter explained to the patient that his infection was getting better, but the cancer was not and that it would not. She explained that he may feel better for a short time, but that he would not get stronger. Patient did express some understanding after several conversations. He is eating small amounts of food brought from home. Foley continues with blood tinged urine. He continues on IV aclyclovir, abt and steroids changed to po route. He remains in patient. Patient's wife and friend present at the end of visit, questions answered through the interpreter.  Patient's home medication list and transfer summary in place on shadow chart.  Please call [facility] @ [phone]- ask for RN Liaison or after hours,ask for on-call RN with any hospice needs.   Thank you. Tracey Harries, RN  [facility]  Hospice Liaison  [phone])

## 2014-08-03 NOTE — Progress Notes (Signed)
ANTIBIOTIC CONSULT NOTE  Pharmacy Consult for acyclovir --> valacyclovir Indication: mucutaneous HSV  No Known Allergies  Patient Measurements: Weight: 138 lb 10.7 oz (62.9 kg) Ideal Body Weight: 61.5kg  Assessment: 10 yoM admitted 1/12 with increasing ShOB, CP, nausea. Pt with recent admission 11/30-12/4 for MRSA thumb abscess (recenlty received vancomycin, Zosyn, bactrim, and oritavancin). Noted patient with metastatic lung cancer currently on home hospice. Pt in a lot of pain this admission and MD notes suspected herpetic lesions in patient's mouth. Pharmacy is consulted to dose IV acyclovir for mucutaneous HSV. Patient is s/p 3 days vancomycin and Zosyn for HCAP vs post-obstructive pneumonia, now changed to PO doxycycline per MD. Patient is s/p 3 days IV acyclovir changing to PO Valtrex for another 5 days to get on all PO regimen in preparation for eventual discharge.  Antiinfectives  1/13 >> acyclovir >> 1/15 1/13 >> vancomycin >> 1/15 1/13 >> Zosyn >> 1/15 1/15 >> doxycycline (MD) >> 1/15 >> valacyclovir >> (1/20)  Labs / vitals Tmax: remains afebrile WBCs: improved to 21k Renal: SCr improved to 0.55 (baseline appears ~0.5), CrCl 95 ml/min CG Lactic acid: 4.74 (1/12)  Previous microbiology 11/30 R thumb abscess : abundant MRSA  No cultures this admission 1/12 MRSA PCR: negative 1/13 UA grossly negative for UTI   Goal of Therapy:  valacyclovir per indication and renal function  Plan:  - last dose of acyclovir 315mg  IV today at 12 noon - start valacyclovir 1g PO BID tonight at 2000 - stop date of 1/20 entered for valacyclvir - no dose adjustments anticipated, Pharmacy will sign-off, please reconsult if needed  Thank you for the consult.  Currie Paris, PharmD, BCPS Pager: [phone] Pharmacy: [phone] 08/03/2014 10:44 AM

## 2014-08-03 NOTE — Progress Notes (Addendum)
Progress Note   Russell Stanley TFT:[id number] DOB: 08-23-1965 DOA: 08/13/2014 PCP: No PCP Per Patient   Brief Narrative:   Russell Stanley is an 49 y.o. male with a PMH of stage IV lung cancer with brain metastasis, status post palliative radiation therapy, TB exposure, MRSA hand abscess recently discharged from [facility] 06/25/14 for treatment of this, who was admitted 08/11/2014 with increased work of breathing, chest pain and nausea. He is on home hospice care.  Assessment/Plan:   Principal Problem:   Acute respiratory failure with hypoxia   Multifactorial with progressive lung cancer, postobstructive healthcare pneumonia.  Family meeting held 08/01/14. Wife understands that his condition is terminal.  Repeat chest x-ray done 08/01/14 in light of significant respiratory distress which showed worsening airspace disease. Antibiotics were started and his respiratory status has stabilized somewhat.  Continue supplemental oxygen, titrate to maintain oxygen saturations greater than 90%.  Active Problems:   Brain metastases  Status post palliative radiation therapy.  Continue Decadron.  Change to oral route.    Primary lung cancer with metastasis from lung to other site with cancer associated pain and air hunger  Poor prognosis, focus on comfort, but patient and his family do wish to have treatable illnesses treated.  Pain and air hunger currently being managed with dilaudid at 1 mg/ hour.    Tachycardia  Compensatory, related to hypoxia and tachypnea.    Sepsis secondary to HCAP (healthcare-associated pneumonia)  Has been on empiric vancomycin and Zosyn for postobstructive pneumonia.  Narrow to doxycycline.    Oral herpes simplex infection  Continue acyclovir.  Mouth pain improved.    DVT Prophylaxis  Comfort care.  Code Status: DNR Family Communication: Family updated 08/01/14 with an interpreter. Disposition Plan: Likely to experience an in-hospital death.   IV Access:     Peripheral IV   Procedures and diagnostic studies:   Dg Chest 2 View 07/22/2014: 1. Innumerable bilateral pulmonary nodules consistent with known metastatic disease again noted. 2. Persistent consolidation left upper lobe. Persistent cystic changes left lung base. 3. Stable cardiomegaly.  No CHF.     Dg Chest Port 1 View 08/01/2014: Diffuse pulmonary metastases.  Continued left upper lobe opacification.  Worsening airspace disease in the right upper lobe could reflect pneumonia or worsening metastatic disease.     Medical Consultants:    Dr. Lane Hacker, Palliative Care  Anti-Infectives:    Vancomycin 08/01/14--->  Zosyn 08/01/14--->  Acyclovir 08/01/14--->  Subjective:   Russell Stanley is improving.  An interpreter was used to communicate with him.  He tells me he is not having pain, but still feels very weak.  He is trying to eat, and his mouth pain/swallowing is better.  He gets very short of breath with any activity, and is requesting a wheelchair if he gets well enough to return home.  Objective:    Filed Vitals:   08/02/14 1447 08/02/14 1939 08/03/14 0430 08/03/14 0500  BP: 132/96  125/83   Pulse: 67  126 97  Temp: 98.2 F (36.8 C)  97.1 F (36.2 C)   TempSrc: Oral  Oral   Resp: 24  26   Weight:      SpO2: 96% 94% 98%     Intake/Output Summary (Last 24 hours) at 08/03/14 0735 Last data filed at 08/02/14 2300  Gross per 24 hour  Intake      0 ml  Output   1250 ml  Net  -1250 ml    Exam: Gen:  NAD Cardiovascular: Tachy, No M/R/G Respiratory:  Lungs with bilateral rhonchi/rub/wheeze, tachypnea improved. Gastrointestinal:  Abdomen soft, NT/ND, + BS Extremities:  + Edema   Data Reviewed:    Labs: Basic Metabolic Panel:  Recent Labs Lab 07/21/2014 1341 08/03/14 0502  NA 135 133*  K 4.1 4.0  CL 101 99  CO2 18* 23  GLUCOSE 150* 174*  BUN 23 18  CREATININE 0.72 0.55  CALCIUM 8.6 8.9   GFR Estimated Creatinine Clearance: 95 mL/min (by C-G  formula based on Cr of 0.55). Liver Function Tests:  Recent Labs Lab 07/28/2014 1341  AST 45*  ALT 21  ALKPHOS 630*  BILITOT 1.1  PROT 8.3  ALBUMIN 3.5    Recent Labs Lab 08/10/2014 1341  LIPASE 27   CBC:  Recent Labs Lab 07/30/2014 1341 08/03/14 0502  WBC 27.0* 21.0*  NEUTROABS 23.5*  --   HGB 11.4* 8.7*  HCT 34.7* 27.4*  MCV 89.9 88.1  PLT 111* 108*   Cardiac Enzymes:  Recent Labs Lab 07/27/2014 1341  TROPONINI 0.19*   Sepsis Labs:  Recent Labs Lab 08/13/2014 1341 08/06/2014 1403 08/03/14 0502  WBC 27.0*  --  21.0*  LATICACIDVEN  --  4.74*  --    Microbiology Recent Results (from the past 240 hour(s))  MRSA PCR Screening     Status: None   Collection Time: 07/23/2014 11:50 PM  Result Value Ref Range Status   MRSA by PCR NEGATIVE NEGATIVE Final    Comment:        The GeneXpert MRSA Assay (FDA approved for NASAL specimens only), is one component of a comprehensive MRSA colonization surveillance program. It is not intended to diagnose MRSA infection nor to guide or monitor treatment for MRSA infections.      Medications:   . acyclovir  315 mg Intravenous Q8H  . dexamethasone  4 mg Intravenous Q6H  . levalbuterol  0.63 mg Nebulization Q6H  . magic mouthwash w/lidocaine  5 mL Oral QID  . piperacillin-tazobactam (ZOSYN)  IV  3.375 g Intravenous Q8H  . scopolamine  1 patch Transdermal Q72H  . vancomycin  750 mg Intravenous Q8H   Continuous Infusions: . HYDROmorphone 1 mg/hr (08/02/14 1808)    Time spent: 25 minutes.    LOS: 3 days   [facility] Pager [phone]. If unable to reach me by pager, please call my cell phone at [phone].  *Please refer to [email or web address], password [email or web address] to get updated schedule on who will round on this patient, as hospitalists switch teams weekly. If 7PM-7AM, please contact night-coverage at [email or web address], password [email or web address] for any overnight needs.  08/03/2014, 7:35 AM

## 2014-08-03 NOTE — Progress Notes (Signed)
CSW continuing to follow.   CSW reviewed chart and spoke with MD. CSW noted [facility] SW and RN liaison were able to have joint visit with pt with MD. Per MD, pt currently on broad spectrum antibiotics and plan is to narrow antibiotics and transition to oral antibiotics and reevaluate pt's status.   CSW to continue to follow. Appreciate [facility] SW and [facility] RN support to pt during hospitalization.   CSW to continue to follow.   Alison Murray, MSW, [facility] [phone]

## 2014-08-03 NOTE — Progress Notes (Signed)
RM [place] MSW Note:  Joint visit with RN liaison Craige Cotta., this SW, Dr. Rockne Menghini. Language Line used. Pt reported some pain at IV site on both arms particularly the left. It was explained to pt that although his sx have improved, his cancer is worsening causing the problems he is having. His spouse and a family friend arrived at the end of the visit. Pt is not used to eating American foods so encouraged spouse to bring him foods he prefers to eat. SW will return on Monday to check pt's status and extend support as needed.Russell Needy [place],LCSW

## 2014-08-03 NOTE — Evaluation (Signed)
Physical Therapy Evaluation Patient Details Name: Russell Stanley MRN: [id number] DOB: [birth year] Today's Date: 08/03/2014   History of Present Illness  49 yo male admitted with acute respiratory failure. Hx of lung cancer with brain mets, EB exposure, tachycardia.   Clinical Impression  On eval, pt required Min assist for mobility-sat EOB for ~10 minutes total. Stood briefly (a few seconds) x 2 and scooted towards [place]. O2 85% 4L, HR 150 bpm with minimal activity. Long seated rest breaks needed throughout session to allow for recovery. End of session: O2 91% 4L, HR 140 bpm. IV line dislodged during session-made RN aware.     Follow Up Recommendations Supervision/Assistance - 24 hour (home with hospice vs hospice facility)    Equipment Recommendations  Wheelchair (measurements PT);Wheelchair cushion (measurements PT);Hospital bed    Recommendations for Other Services       Precautions / Restrictions Precautions Precautions: Fall Precaution Comments: monitor vitals Restrictions Weight Bearing Restrictions: No      Mobility  Bed Mobility Overal bed mobility: Needs Assistance Bed Mobility: Supine to Sit;Sit to Supine     Supine to sit: Min guard;HOB elevated Sit to supine: Min guard;HOB elevated   General bed mobility comments: Increased time. Seated rest break with 1 leg off bed for ~5 or so minutes before pt was able to fully get back into bed.   Transfers Overall transfer level: Needs assistance   Transfers: Sit to/from Stand Sit to Stand: Min assist         General transfer comment: Assist to rise, stabilize, control descent. Brief sit to stand x 2.  Lateral scoot/small side step towards HOB. Pt declined OOB to recliner. O2 85% 4L, HR 150 bpm during activity.   Ambulation/Gait             General Gait Details: NT-pt unable to tolerate at this time  Stairs            Wheelchair Mobility    Modified Rankin (Stroke Patients Only)       Balance Overall  balance assessment: Needs assistance         Standing balance support: During functional activity Standing balance-Leahy Scale: Poor                               Pertinent Vitals/Pain Pain Assessment: No/denies pain    Home Living   Living Arrangements: Children;Spouse/significant other               Additional Comments: no family/interpreter present to provide home/PLOF info. Pt speaks very little Engllish    Prior Function                 Hand Dominance        Extremity/Trunk Assessment   Upper Extremity Assessment: Generalized weakness           Lower Extremity Assessment: Generalized weakness         Communication   Communication: Prefers language other than English  Cognition Arousal/Alertness: Awake/alert Behavior During Therapy: WFL for tasks assessed/performed Overall Cognitive Status: Difficult to assess                      General Comments      Exercises        Assessment/Plan    PT Assessment Patient needs continued PT services  PT Diagnosis Difficulty walking;Generalized weakness   PT Problem List Decreased strength;Decreased activity tolerance;Decreased balance;Decreased mobility;Cardiopulmonary status  limiting activity  PT Treatment Interventions Functional mobility training;Therapeutic activities;Therapeutic exercise;Patient/family education;DME instruction;Balance training;Gait training   PT Goals (Current goals can be found in the Care Plan section) Acute Rehab PT Goals Patient Stated Goal: none stated PT Goal Formulation: Patient unable to participate in goal setting Time For Goal Achievement: 08/17/14 Potential to Achieve Goals: Poor    Frequency Min 2X/week   Barriers to discharge        Co-evaluation               End of Session   Activity Tolerance: Patient limited by fatigue (Limited by O2 sats dropping, increased HR ) Patient left: in bed;with call bell/phone within reach            Time: 1429-1449 PT Time Calculation (min) (ACUTE ONLY): 20 min   Charges:   PT Evaluation $Initial PT Evaluation Tier I: 1 Procedure PT Treatments $Therapeutic Activity: 8-22 mins   PT G Codes:        Weston Anna, MPT Pager: [phone]

## 2014-08-04 DIAGNOSIS — R000 Tachycardia, unspecified: Secondary | ICD-10-CM

## 2014-08-04 LAB — BASIC METABOLIC PANEL
Anion gap: 15 (ref 5–15)
BUN: 44 mg/dL — ABNORMAL HIGH (ref 6–23)
CO2: 18 mmol/L — ABNORMAL LOW (ref 19–32)
Calcium: 8.3 mg/dL — ABNORMAL LOW (ref 8.4–10.5)
Chloride: 99 mEq/L (ref 96–112)
Creatinine, Ser: 1.42 mg/dL — ABNORMAL HIGH (ref 0.50–1.35)
GFR calc Af Amer: 65 mL/min — ABNORMAL LOW (ref >90)
GFR, EST NON AFRICAN AMERICAN: 56 mL/min — AB (ref >90)
Glucose, Bld: 275 mg/dL — ABNORMAL HIGH (ref 70–99)
POTASSIUM: 5.3 mmol/L — AB (ref 3.5–5.1)
SODIUM: 132 mmol/L — AB (ref 135–145)

## 2014-08-04 LAB — CBC
HCT: 29.4 % — ABNORMAL LOW (ref 39.0–52.0)
Hemoglobin: 9.3 g/dL — ABNORMAL LOW (ref 13.0–17.0)
MCH: 28.5 pg (ref 26.0–34.0)
MCHC: 31.6 g/dL (ref 30.0–36.0)
MCV: 90.2 fL (ref 78.0–100.0)
PLATELETS: 100 10*3/uL — AB (ref 150–400)
RBC: 3.26 MIL/uL — ABNORMAL LOW (ref 4.22–5.81)
RDW: 16.9 % — AB (ref 11.5–15.5)
WBC: 29.4 10*3/uL — AB (ref 4.0–10.5)

## 2014-08-04 LAB — LACTIC ACID, PLASMA: LACTIC ACID, VENOUS: 5.3 mmol/L — AB (ref 0.5–2.2)

## 2014-08-04 MED ORDER — PIPERACILLIN-TAZOBACTAM 3.375 G IVPB
3.3750 g | Freq: Three times a day (TID) | INTRAVENOUS | Status: DC
  Administered 2014-08-04: 3.375 g via INTRAVENOUS
  Filled 2014-08-04 (×2): qty 50

## 2014-08-20 NOTE — Progress Notes (Signed)
ANTIBIOTIC CONSULT NOTE - INITIAL  Pharmacy Consult for Zosyn Indication: HCAP  No Known Allergies  Patient Measurements: Weight: 138 lb 10.7 oz (62.9 kg)  Vital Signs:   Intake/Output from previous day: 01/15 0701 - 01/16 0700 In: 1520 [P.O.:1320] Out: 775 [Urine:775] Intake/Output from this shift: Total I/O In: -  Out: 150 [Urine:150]  Labs:  Recent Labs  08/03/14 0502  WBC 21.0*  HGB 8.7*  PLT 108*  CREATININE 0.55   Estimated Creatinine Clearance: 95 mL/min (by C-G formula based on Cr of 0.55). No results for input(s): VANCOTROUGH, VANCOPEAK, VANCORANDOM, GENTTROUGH, GENTPEAK, GENTRANDOM, TOBRATROUGH, TOBRAPEAK, TOBRARND, AMIKACINPEAK, AMIKACINTROU, AMIKACIN in the last 72 hours.   Microbiology: Recent Results (from the past 720 hour(s))  MRSA PCR Screening     Status: None   Collection Time: 08/17/2014 11:50 PM  Result Value Ref Range Status   MRSA by PCR NEGATIVE NEGATIVE Final    Comment:        The GeneXpert MRSA Assay (FDA approved for NASAL specimens only), is one component of a comprehensive MRSA colonization surveillance program. It is not intended to diagnose MRSA infection nor to guide or monitor treatment for MRSA infections.     Medical History: Past Medical History  Diagnosis Date  . 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 Reflux   . Primary lung cancer with metastasis from lung to other site 05/11/2014    Medications:  Anti-infectives    Start     Dose/Rate Route Frequency Ordered Stop   08/03/14 2000  valACYclovir (VALTREX) tablet 1,000 mg     1,000 mg Oral 2 times daily 08/03/14 1042 08/09/14 0759   08/03/14 1200  doxycycline (VIBRA-TABS) tablet 100 mg  Status:  Discontinued     100 mg Oral Every 12 hours 08/03/14 1026 2014/08/15 1108   08/02/14 0000  vancomycin (VANCOCIN) IVPB 750 mg/150 ml premix  Status:  Discontinued     750 mg150 mL/hr over 60 Minutes Intravenous Every 8 hours 08/01/14 1419 08/03/14 1026   08/01/14 2200   piperacillin-tazobactam (ZOSYN) IVPB 3.375 g  Status:  Discontinued     3.375 g12.5 mL/hr over 240 Minutes Intravenous Every 8 hours 08/01/14 1421 08/03/14 1026   08/01/14 2000  acyclovir (ZOVIRAX) 315 mg in dextrose 5 % 100 mL IVPB     315 mg106.3 mL/hr over 60 Minutes Intravenous Every 8 hours 08/01/14 1123 08/03/14 1229   08/01/14 1600  vancomycin (VANCOCIN) 1,500 mg in sodium chloride 0.9 % 500 mL IVPB     1,500 mg250 mL/hr over 120 Minutes Intravenous  Once 08/01/14 1418 08/01/14 1741   08/01/14 1500  piperacillin-tazobactam (ZOSYN) IVPB 3.375 g     3.375 g100 mL/hr over 30 Minutes Intravenous  Once 08/01/14 1417 08/01/14 1517   08/01/14 1230  acyclovir (ZOVIRAX) 315 mg in dextrose 5 % 100 mL IVPB     315 mg106.3 mL/hr over 60 Minutes Intravenous  Once 08/01/14 1123 08/01/14 1310     Assessment: 28 yoM admitted 1/12 with increasing ShOB, CP, nausea. Pt with recent admission 11/30-12/4 for MRSA thumb abscess (recently received vancomycin, Zosyn, bactrim, and oritavancin). Noted patient with metastatic lung cancer currently on home hospice. Pt in a lot of pain this admission and MD notes suspected herpetic lesions in patient's mouth. Pharmacy is consulted to dose IV acyclovir for mucutaneous HSV and Vanc and Zosyn for HCAP.  Significant events: 1/15: Switched to Doxycycline 1/16: Worsening respiratory distress so Doxy switched back to Zosyn.  1/13 >> acyclovir >> 1/15  1/13 >> vancomycin >> 1/15 1/13 >> Zosyn >> 1/15, 1/16 >>  1/15 >> doxycycline (MD) >> 1/16 1/15 >> valacyclovir >> (1/20)  Tmax: remains afebrile WBCs: improved to 21k Renal: SCr improved to 0.55 (baseline appears ~0.5), CrCl 95 ml/min CG Lactic acid: 4.74 (1/12)  Previous microbiology 11/30 R thumb abscess : abundant MRSA  No cultures this admission 1/12 MRSA PCR: negative 1/13 UA grossly negative for UTI  Goal of Therapy:  Appropriate antibiotic dosing for renal function; eradication of infection  Plan:   Resume Zosyn 3.375g IV Q8H infused over 4hrs. Follow up renal fxn and culture results.  Romeo Rabon, PharmD, pager [phone]. 08-07-2014,11:34 AM.

## 2014-08-20 NOTE — Progress Notes (Signed)
Unable to appreciate breath sounds and heart beat,no pulses.Pronounced dead at 1710 with Delora Fuel RN. Dr Rama notified , family notified. Wasted 20 ml of dilaudid drip 0.5 mgs /ml

## 2014-08-20 NOTE — Discharge Summary (Signed)
Death Summary      Russell Stanley BFX:[id number] DOB: Apr 27, 1966 DOA: 08-14-2014  PCP: No PCP Per Patient PCP/Office notified: No PCP.  Death summary forwarded to Dr. Alvy Bimler.  Admit date: 14-Aug-2014 Date of Death: 08/18/2014   Final Diagnoses:   Principal Problem:    Acute respiratory failure with hypoxia secondary to stage IV lung cancer and healthcare associated pneumonia Active Problems:    Brain metastases    Primary lung cancer with metastasis from lung to other site    Tachycardia    Acute respiratory failure with hypoxemia    HCAP (healthcare-associated pneumonia)    Oral herpes simplex infection    Sepsis    Cancer associated pain    Air hunger   History of Present Illness:   Russell Stanley is an 48 y.o. male with a PMH of stage IV lung cancer with brain metastasis on home hospice care, status post palliative radiation therapy, prior TB exposure, MRSA hand abscess recently discharged from [facility] 06/25/14 for treatment of this, who was admitted 08-14-14 with increased work of breathing, chest pain and nausea. Upon initial evaluation, he was found to be septic with a post obstructive healthcare-associated pneumonia.  Hospital Course:   The patient was admitted and placed on Vancomycin, Zosyn (to treat HAP) and Acyclovir (to treat oral herpes).  His prognosis was felt to be poor, and after a discussion with him via an interpreter, he was made a DNR.  He required a continuous infusion of morphine which was switched to dilaudid to manage his symptoms of air hunger and pain.  Supplemental oxygen was provided to treat hypoxia.  Despite antibiotic therapy, the patient's clinical status continued to deteriorate with progressive dyspnea and respiratory failure.  A family meeting was held and his family understood the terminal nature of his condition.  As expected, he died in the hospital from respiratory failure related to stage IV lung cancer and sepsis from healthcare associated pneumonia.     Time of death: 5:10 PM  Signed:  RAMA,CHRISTINA  [facility] 08-18-14, 5:17 PM

## 2014-08-20 NOTE — Progress Notes (Signed)
Progress Note   Russell Stanley IOE:[id number] DOB: 29-Dec-1965 DOA: 07/24/2014 PCP: No PCP Per Patient   Brief Narrative:   Russell Stanley is an 49 y.o. male with a PMH of stage IV lung cancer with brain metastasis, status post palliative radiation therapy, TB exposure, MRSA hand abscess recently discharged from [facility] 06/25/14 for treatment of this, who was admitted 08/06/2014 with increased work of breathing, chest pain and nausea. He is on home hospice care.  Assessment/Plan:   Principal Problem:   Acute respiratory failure with hypoxia, multifactorial   Multifactorial with progressive lung cancer, postobstructive healthcare pneumonia.  Family meeting held 08/01/14. Wife understands that his condition is terminal.  Repeat chest x-ray done 08/01/14 in light of significant respiratory distress which showed worsening airspace disease. Antibiotics (initially vancomycin and Zosyn, narrowed to doxycycline 05-Aug-2014, broaden back to Zosyn given worsening respiratory status.  MRSA PCR negative so will hold off on Vancomycin.  Continue supplemental oxygen, titrate to maintain oxygen saturations greater than 90%.  Active Problems:   Brain metastases  Status post palliative radiation therapy.  Continue oral Decadron.      Primary lung cancer with metastasis from lung to other site with cancer associated pain and air hunger  Poor prognosis, focus on comfort, but patient and his family do wish to have treatable illnesses treated.  Pain and air hunger currently being managed with dilaudid at 1.5 mg/ hour.  Increase to 2 mg/hr given complaints of worsening air hunger.    Tachycardia  Compensatory, related to hypoxia and tachypnea.    Sepsis secondary to HCAP (healthcare-associated pneumonia)  Has been on empiric vancomycin and Zosyn for postobstructive pneumonia, declining on doxycycline.  Resume Zosyn.  No indication for MRSA coverage.    Oral herpes simplex infection  Continue acyclovir.  Mouth  pain improved.    DVT Prophylaxis  Comfort care.  Code Status: DNR Family Communication: Family updated 08/01/14 with an interpreter. Disposition Plan: Likely to experience an in-hospital death.   IV Access:    Peripheral IV   Procedures and diagnostic studies:   Dg Chest 2 View 07/22/2014: 1. Innumerable bilateral pulmonary nodules consistent with known metastatic disease again noted. 2. Persistent consolidation left upper lobe. Persistent cystic changes left lung base. 3. Stable cardiomegaly.  No CHF.     Dg Chest Port 1 View 08/01/2014: Diffuse pulmonary metastases.  Continued left upper lobe opacification.  Worsening airspace disease in the right upper lobe could reflect pneumonia or worsening metastatic disease.     Medical Consultants:    Dr. Lane Hacker, Palliative Care  Anti-Infectives:    Vancomycin 08/01/14---> 08/03/14  Zosyn 08/01/14---> 08/03/14  Acyclovir 08/01/14--->  Levaquin 08/03/14--->  Subjective:   Russell Stanley is worse today, with increased WOB/dyspnea.  An interpreter was used to communicate with him.  He tells me he is not having pain, but still feels very weak.  He does not have an appetite.  He is very short of breath.  Objective:    Filed Vitals:   08/03/14 0749 08/03/14 1356 08/03/14 1603 08/03/14 2058  BP:   123/91 129/91  Pulse:   120 145  Temp:   97.5 F (36.4 C) 97.6 F (36.4 C)  TempSrc:   Oral Oral  Resp:   20 20  Weight:      SpO2: 97% 95% 93% 90%    Intake/Output Summary (Last 24 hours) at 08/05/14 0751 Last data filed at 2014-08-05 0500  Gross per 24 hour  Intake  1520 ml  Output    775 ml  Net    745 ml    Exam: Gen:  NAD Cardiovascular: Tachy, No M/R/G Respiratory:  Lungs with bilateral rhonchi/rub/wheeze, tachypnea worse. Gastrointestinal:  Abdomen soft, NT/ND, + BS Extremities:  + Edema, mottled   Data Reviewed:    Labs: Basic Metabolic Panel:  Recent Labs Lab 08/05/2014 1341 08/03/14 0502  NA 135 133*   K 4.1 4.0  CL 101 99  CO2 18* 23  GLUCOSE 150* 174*  BUN 23 18  CREATININE 0.72 0.55  CALCIUM 8.6 8.9   GFR Estimated Creatinine Clearance: 95 mL/min (by C-G formula based on Cr of 0.55). Liver Function Tests:  Recent Labs Lab 08/05/2014 1341  AST 45*  ALT 21  ALKPHOS 630*  BILITOT 1.1  PROT 8.3  ALBUMIN 3.5    Recent Labs Lab 07/28/2014 1341  LIPASE 27   CBC:  Recent Labs Lab 08/17/2014 1341 08/03/14 0502  WBC 27.0* 21.0*  NEUTROABS 23.5*  --   HGB 11.4* 8.7*  HCT 34.7* 27.4*  MCV 89.9 88.1  PLT 111* 108*   Cardiac Enzymes:  Recent Labs Lab 08/07/2014 1341  TROPONINI 0.19*   Sepsis Labs:  Recent Labs Lab 08/09/2014 1341 08/11/2014 1403 08/03/14 0502  WBC 27.0*  --  21.0*  LATICACIDVEN  --  4.74*  --    Microbiology Recent Results (from the past 240 hour(s))  MRSA PCR Screening     Status: None   Collection Time: 07/26/2014 11:50 PM  Result Value Ref Range Status   MRSA by PCR NEGATIVE NEGATIVE Final    Comment:        The GeneXpert MRSA Assay (FDA approved for NASAL specimens only), is one component of a comprehensive MRSA colonization surveillance program. It is not intended to diagnose MRSA infection nor to guide or monitor treatment for MRSA infections.      Medications:   . dexamethasone  4 mg Oral 4 times per day  . doxycycline  100 mg Oral Q12H  . levalbuterol  0.63 mg Nebulization Q6H  . magic mouthwash w/lidocaine  5 mL Oral QID  . scopolamine  1 patch Transdermal Q72H  . senna  1 tablet Oral QHS  . valACYclovir  1,000 mg Oral BID   Continuous Infusions: . HYDROmorphone 1.5 mg/hr (08/09/14 0650)    Time spent: 25 minutes.    LOS: 4 days   [facility] Pager [phone]. If unable to reach me by pager, please call my cell phone at [phone].  *Please refer to [email or web address], password [email or web address] to get updated schedule on who will round on this patient, as hospitalists switch teams weekly. If 7PM-7AM, please  contact night-coverage at [email or web address], password [email or web address] for any overnight needs.  Aug 09, 2014, 7:51 AM

## 2014-08-20 DEATH — deceased

## 2016-06-12 IMAGING — CT CT CHEST W/O CM
2 of 3 series · 15 of 36 positions shown, 18 images · non-contrast
Comparison: None

CLINICAL DATA: Evaluate chest mass

EXAM:
CT CHEST WITHOUT CONTRAST
TECHNIQUE: Multidetector CT imaging of the chest was performed following the
standard protocol without IV contrast..

[Series 2: thorax 5.0 i31f 1 · axial · 0.71mm/px · z∈[+1427,+1662]mm · 12 of 57 slices shown, 15 images]
[im 5/57  mediastinal]
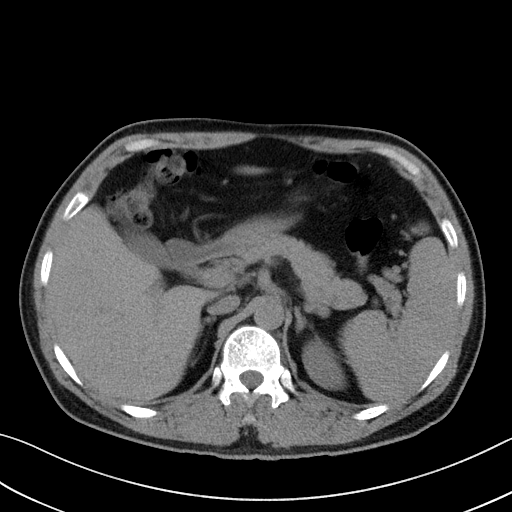
[im 5/57  lung]
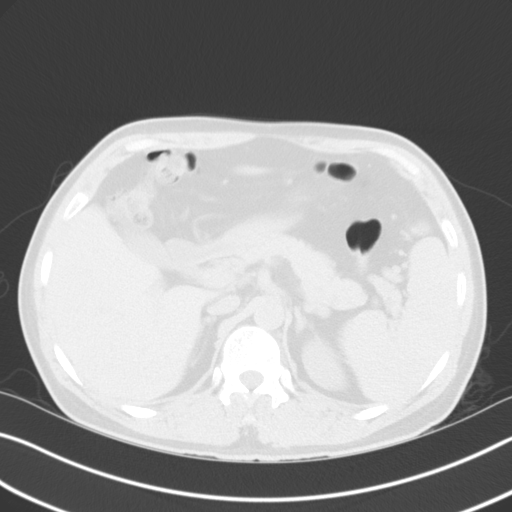
[im 9/57  lung]
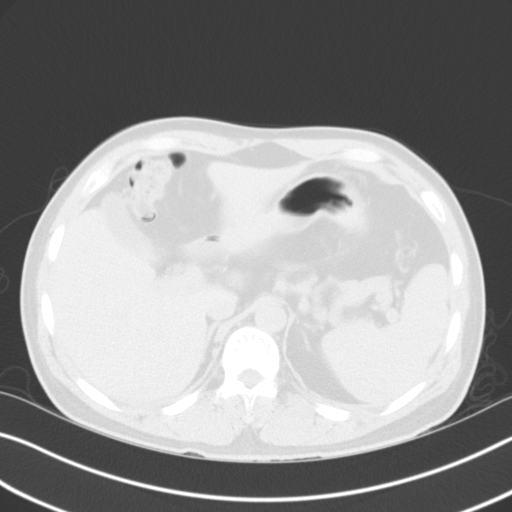
[im 13/57  lung]
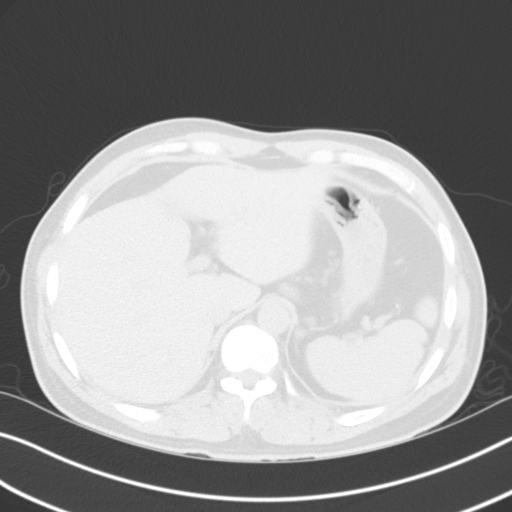
[im 17/57  lung]
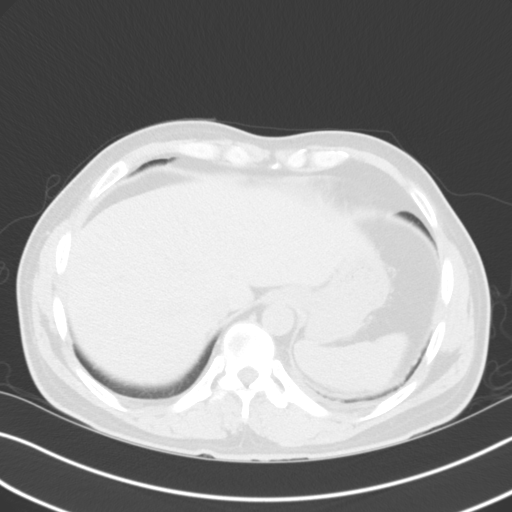
[im 21/57  mediastinal]
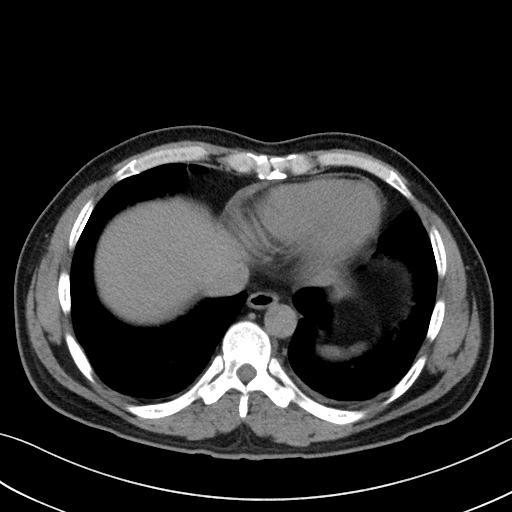
[im 21/57  lung]
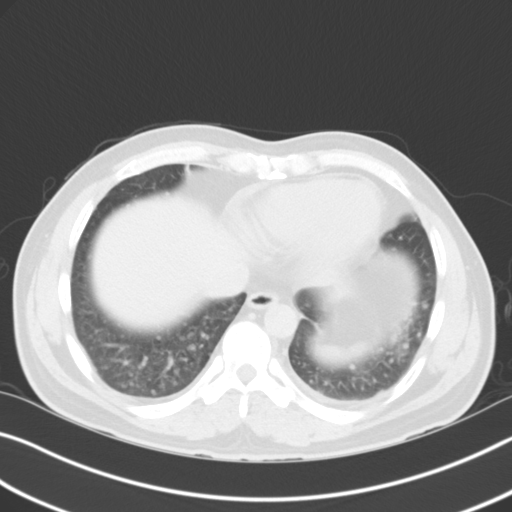
[im 25/57  lung]
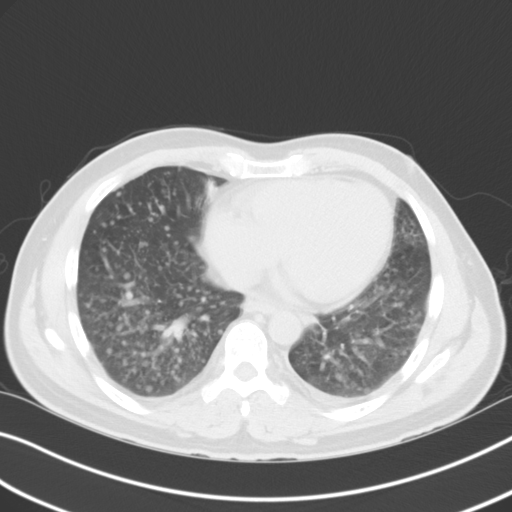
[im 32/57  lung]
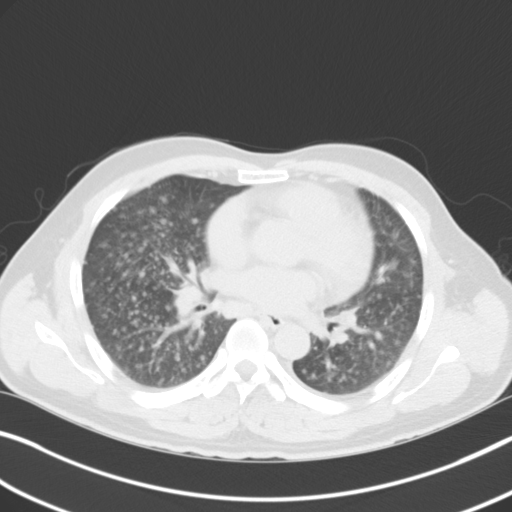
[im 36/57  lung]
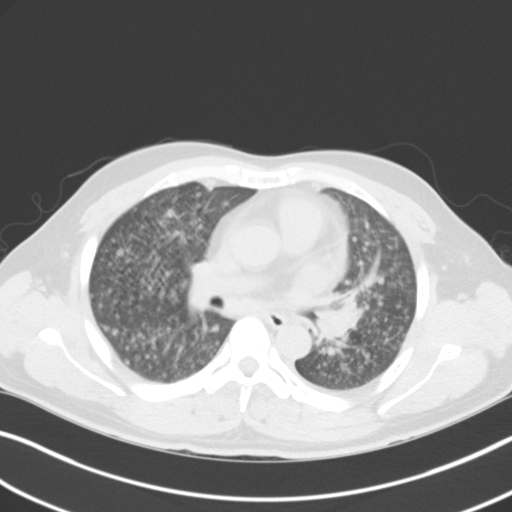
[im 40/57  mediastinal]
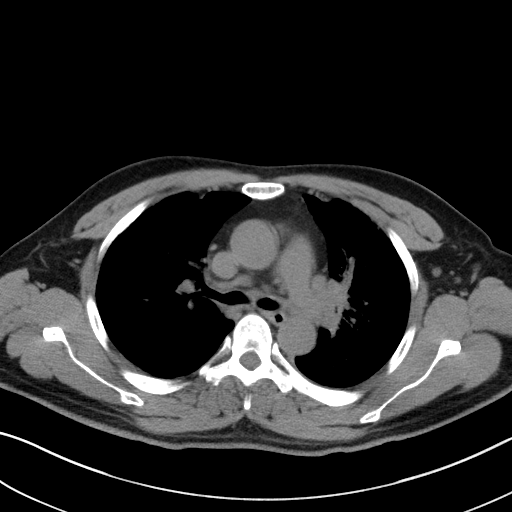
[im 40/57  lung]
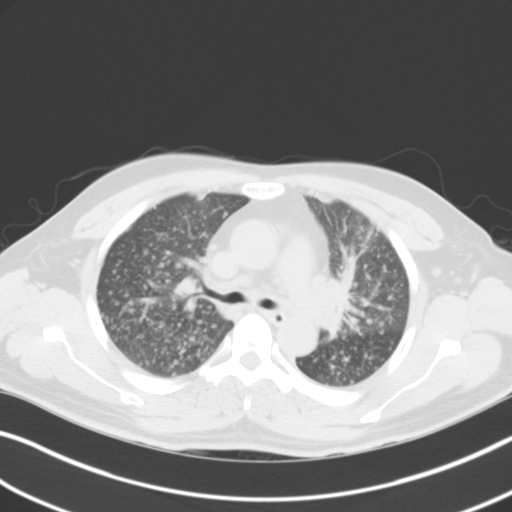
[im 44/57  lung]
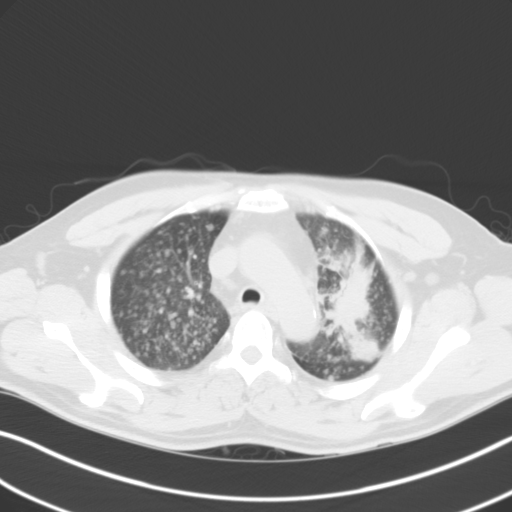
[im 48/57  lung]
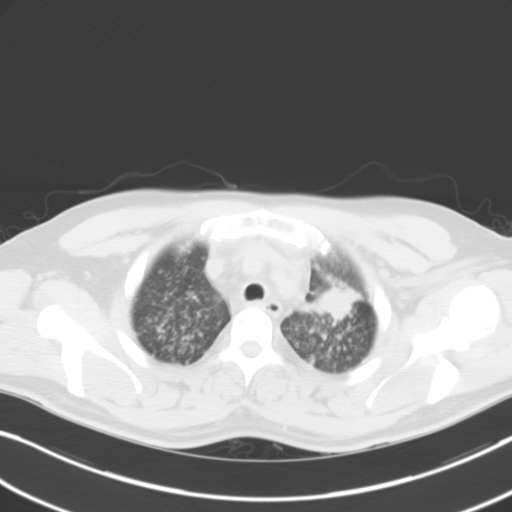
[im 52/57  lung]
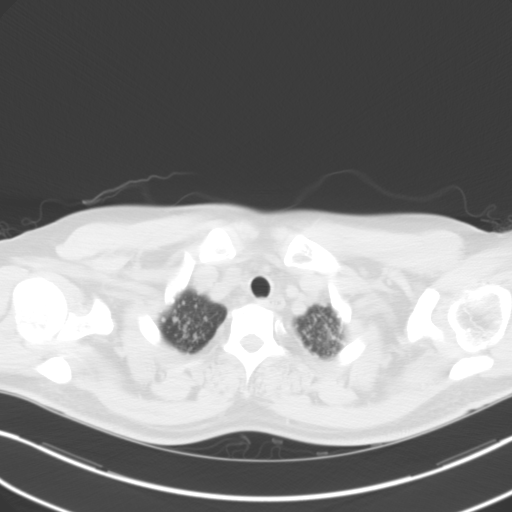

[Series 5: coronal · coronal · 0.59mm/px · 3 of 78 slices shown]
[im 16/78  lung]
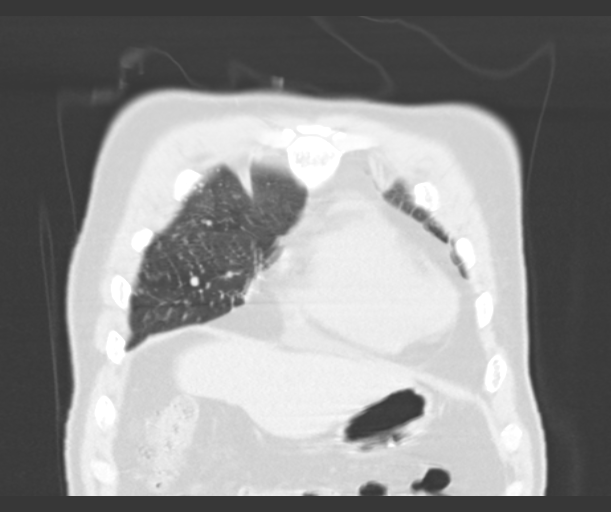
[im 31/78  lung]
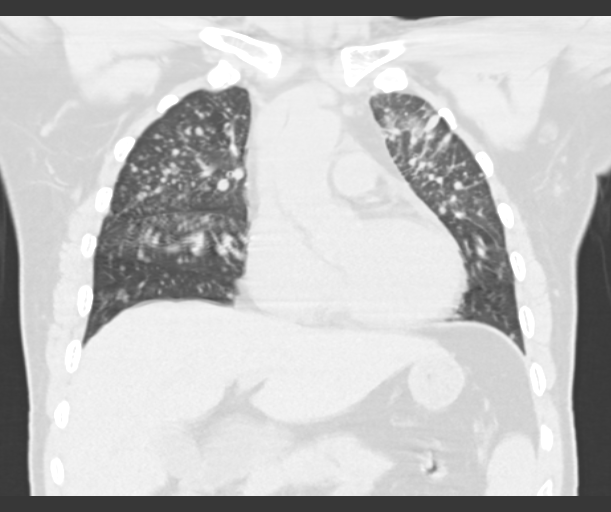
[im 47/78  lung]
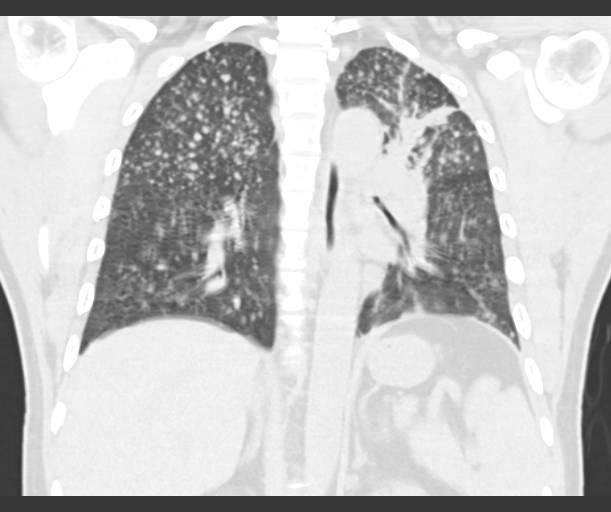

[15 of 36 positions shown; findings below may reference images not displayed]

FINDINGS: Mediastinum: The heart size appears normal. No pericardial effusion.
The trachea appears patent and is midline. The esophagus appears
normal. Sub- carinal lymph node measures 9 mm. There is a right
paratracheal lymph node measuring 9 mm. Multiple, prominent left
axillary lymph nodes measure up to 8 mm.

Lungs/Pleura: There is a mass within the left upper lobe measuring
2.4 x 2.3 by 4.4 cm. This mass extends into the left hilum. There is
involvement and narrowing of the left upper lobe bronchi. Left upper
lobe interlobular septal thickening is identified compatible with
lymphangitic spread of tumor. Innumerable small nodules are
identified throughout both lungs compatible with widespread
metastatic disease. A very small left pleural effusion is noted.

Upper Abdomen: Incidental imaging through the upper abdomen shows no
acute findings. The adrenal glands appear normal. There is no mass.

Musculoskeletal: Evidence of multi focal lytic and sclerotic bone
metastases are identified. Multiple lesions are seen throughout the
thoracic spine. There is a lytic lesion involving the T10 vertebra
with associated pathologic fracture through the superior endplate.
T5 lytic lesion measures 0.9 cm.
IMPRESSION: 1. Left upper lobe lung mass is worrisome for primary pulmonary
neoplasm.
2. Evidence of lymphangitic spread of tumor within the left upper
lobe and diffuse, bilateral pulmonary metastasis.
3. Multi focal bone metastasis with pathologic fracture involving
the superior endplate of T10.

## 2016-09-04 IMAGING — CR DG CHEST 1V PORT
1 series · 1 of 1 positions shown · non-contrast
Comparison: 07/31/2014

CLINICAL DATA: Shortness of breath, respiratory abnormality.
History of lung cancer.

EXAM:
PORTABLE CHEST - 1 VIEW

[AP]
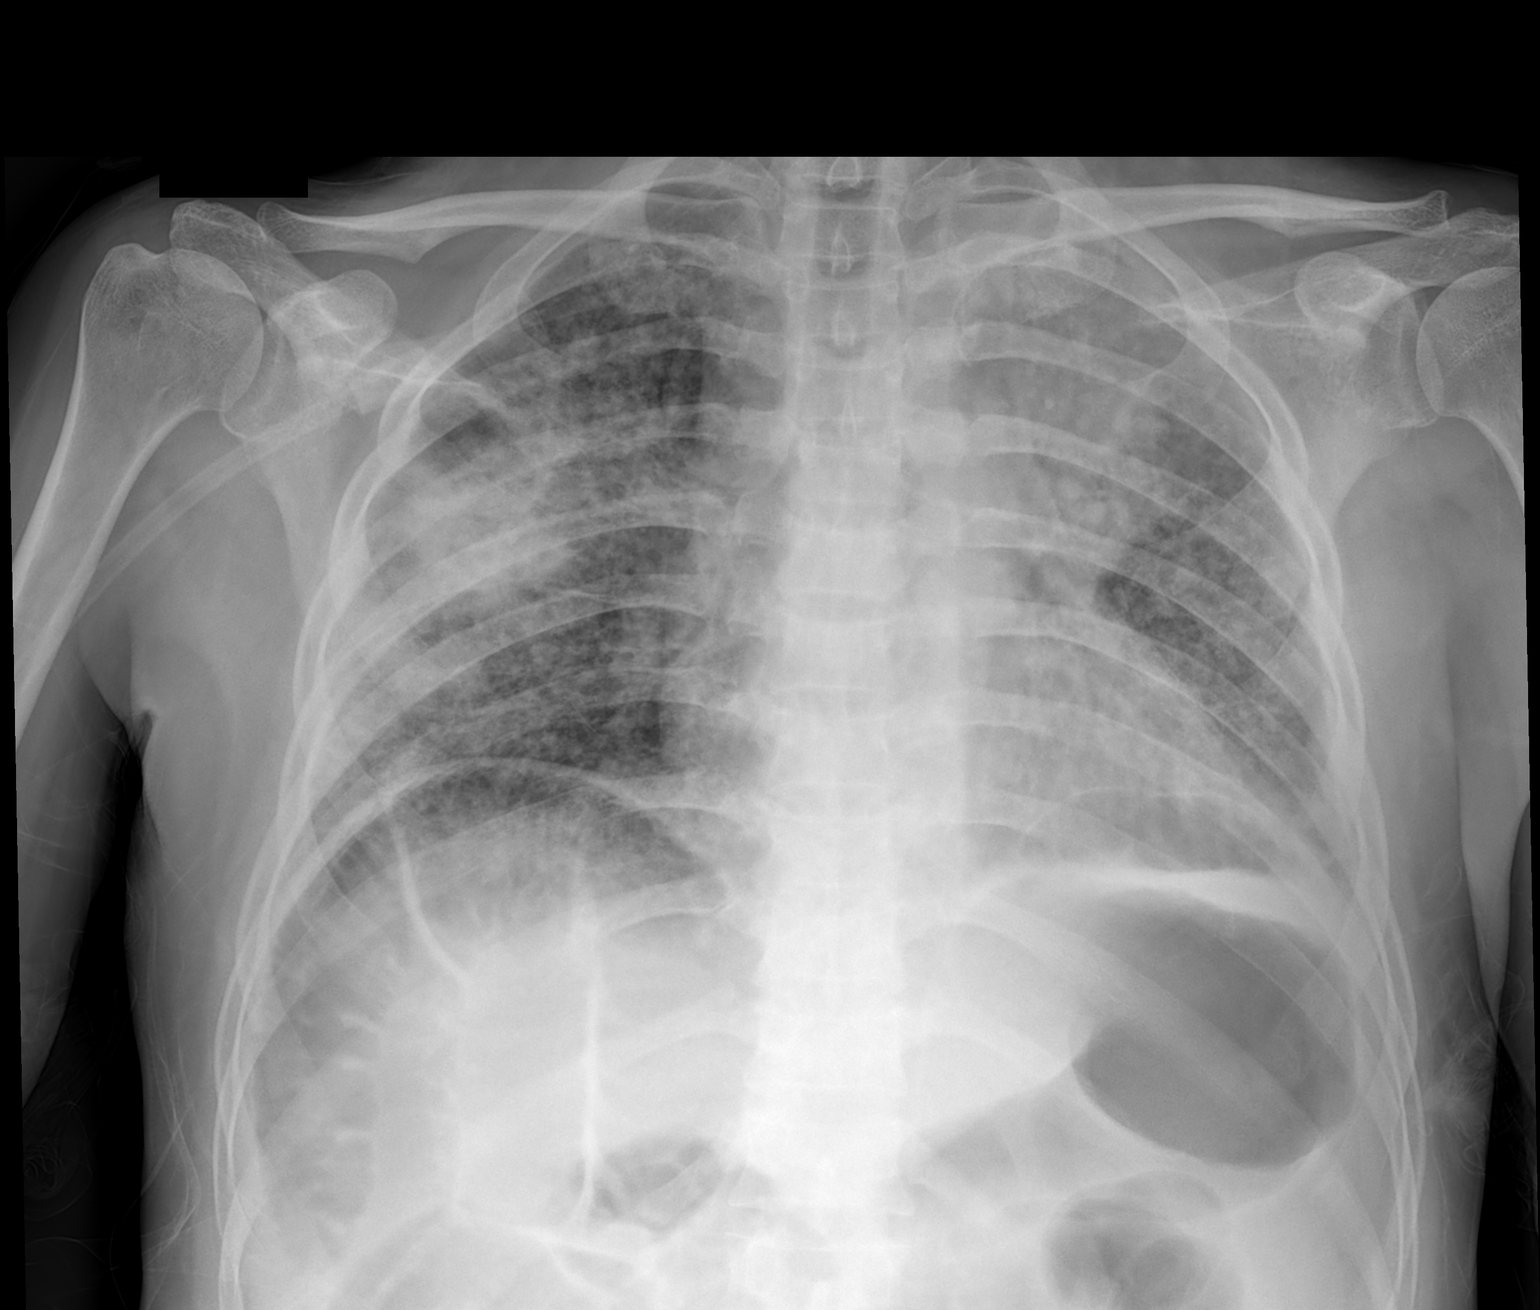

[1 of 1 positions shown; findings below may reference images not displayed]

FINDINGS: Innumerable tiny reticulonodular densities throughout the lungs
compatible with numerous nodules/metastases. Opacification
throughout much of the left upper lobe again noted, stable.
Worsening airspace opacity peripherally in the right upper lobe. No
visible effusions. No acute bony abnormality.
IMPRESSION: Diffuse pulmonary metastases.

Continued left upper lobe opacification.

Worsening airspace disease in the right upper lobe could reflect
pneumonia or worsening metastatic disease.
